# Patient Record
Sex: Female | Born: 1950 | Race: Black or African American | Hispanic: No | Marital: Single | Smoking: Never smoker
Health system: Southern US, Community
[De-identification: ages and names within clinical notes are randomized; demographics above are authoritative.]

## PROBLEM LIST (undated history)

## (undated) DIAGNOSIS — M8580 Other specified disorders of bone density and structure, unspecified site: Secondary | ICD-10-CM

## (undated) DIAGNOSIS — M1990 Unspecified osteoarthritis, unspecified site: Secondary | ICD-10-CM

## (undated) DIAGNOSIS — K449 Diaphragmatic hernia without obstruction or gangrene: Secondary | ICD-10-CM

## (undated) DIAGNOSIS — N3281 Overactive bladder: Secondary | ICD-10-CM

## (undated) DIAGNOSIS — N6489 Other specified disorders of breast: Secondary | ICD-10-CM

## (undated) DIAGNOSIS — K219 Gastro-esophageal reflux disease without esophagitis: Secondary | ICD-10-CM

## (undated) DIAGNOSIS — I10 Essential (primary) hypertension: Secondary | ICD-10-CM

## (undated) DIAGNOSIS — F419 Anxiety disorder, unspecified: Secondary | ICD-10-CM

## (undated) HISTORY — PX: ABDOMINAL MASS RESECTION: SHX1110

## (undated) HISTORY — PX: BLADDER SURGERY: SHX569

## (undated) HISTORY — PX: BREAST BIOPSY: SHX20

## (undated) HISTORY — PX: UTERINE FIBROID SURGERY: SHX826

## (undated) HISTORY — PX: PARTIAL HYSTERECTOMY: SHX80

## (undated) HISTORY — DX: Diaphragmatic hernia without obstruction or gangrene: K44.9

## (undated) HISTORY — DX: Other specified disorders of bone density and structure, unspecified site: M85.80

## (undated) HISTORY — PX: ABDOMINAL HYSTERECTOMY: SHX81

## (undated) HISTORY — DX: Gastro-esophageal reflux disease without esophagitis: K21.9

## (undated) HISTORY — DX: Overactive bladder: N32.81

---

## 1998-11-16 ENCOUNTER — Ambulatory Visit (HOSPITAL_COMMUNITY): Admission: RE | Admit: 1998-11-16 | Discharge: 1998-11-16 | Payer: Self-pay | Admitting: *Deleted

## 1999-08-19 ENCOUNTER — Other Ambulatory Visit: Admission: RE | Admit: 1999-08-19 | Discharge: 1999-08-19 | Payer: Self-pay | Admitting: *Deleted

## 2000-06-23 ENCOUNTER — Other Ambulatory Visit: Admission: RE | Admit: 2000-06-23 | Discharge: 2000-06-23 | Payer: Self-pay | Admitting: *Deleted

## 2000-07-02 ENCOUNTER — Encounter: Payer: Self-pay | Admitting: Emergency Medicine

## 2000-07-02 ENCOUNTER — Emergency Department (HOSPITAL_COMMUNITY): Admission: EM | Admit: 2000-07-02 | Discharge: 2000-07-02 | Payer: Self-pay | Admitting: Emergency Medicine

## 2000-10-29 ENCOUNTER — Encounter: Payer: Self-pay | Admitting: *Deleted

## 2000-10-29 ENCOUNTER — Encounter: Admission: RE | Admit: 2000-10-29 | Discharge: 2000-10-29 | Payer: Self-pay | Admitting: *Deleted

## 2002-05-04 ENCOUNTER — Other Ambulatory Visit: Admission: RE | Admit: 2002-05-04 | Discharge: 2002-05-04 | Payer: Self-pay | Admitting: *Deleted

## 2002-10-14 ENCOUNTER — Encounter: Admission: RE | Admit: 2002-10-14 | Discharge: 2002-10-14 | Payer: Self-pay | Admitting: *Deleted

## 2002-10-14 ENCOUNTER — Encounter: Payer: Self-pay | Admitting: *Deleted

## 2003-04-24 ENCOUNTER — Other Ambulatory Visit: Admission: RE | Admit: 2003-04-24 | Discharge: 2003-04-24 | Payer: Self-pay | Admitting: *Deleted

## 2003-07-21 ENCOUNTER — Ambulatory Visit (HOSPITAL_COMMUNITY): Admission: RE | Admit: 2003-07-21 | Discharge: 2003-07-21 | Payer: Self-pay | Admitting: Urology

## 2003-07-21 ENCOUNTER — Encounter (INDEPENDENT_AMBULATORY_CARE_PROVIDER_SITE_OTHER): Payer: Self-pay | Admitting: Specialist

## 2003-07-21 ENCOUNTER — Ambulatory Visit (HOSPITAL_BASED_OUTPATIENT_CLINIC_OR_DEPARTMENT_OTHER): Admission: RE | Admit: 2003-07-21 | Discharge: 2003-07-21 | Payer: Self-pay | Admitting: Urology

## 2004-03-25 ENCOUNTER — Encounter: Admission: RE | Admit: 2004-03-25 | Discharge: 2004-03-25 | Payer: Self-pay | Admitting: *Deleted

## 2004-04-09 ENCOUNTER — Other Ambulatory Visit: Admission: RE | Admit: 2004-04-09 | Discharge: 2004-04-09 | Payer: Self-pay | Admitting: *Deleted

## 2004-07-07 ENCOUNTER — Ambulatory Visit (HOSPITAL_COMMUNITY): Admission: RE | Admit: 2004-07-07 | Discharge: 2004-07-07 | Payer: Self-pay | Admitting: Emergency Medicine

## 2005-01-24 ENCOUNTER — Emergency Department (HOSPITAL_COMMUNITY): Admission: EM | Admit: 2005-01-24 | Discharge: 2005-01-24 | Payer: Self-pay | Admitting: Emergency Medicine

## 2005-01-28 ENCOUNTER — Ambulatory Visit: Payer: Self-pay | Admitting: Internal Medicine

## 2005-04-09 ENCOUNTER — Ambulatory Visit: Payer: Self-pay | Admitting: Internal Medicine

## 2005-04-14 ENCOUNTER — Ambulatory Visit: Payer: Self-pay | Admitting: Internal Medicine

## 2005-05-07 ENCOUNTER — Other Ambulatory Visit: Admission: RE | Admit: 2005-05-07 | Discharge: 2005-05-07 | Payer: Self-pay | Admitting: *Deleted

## 2005-05-07 ENCOUNTER — Encounter: Admission: RE | Admit: 2005-05-07 | Discharge: 2005-05-07 | Payer: Self-pay | Admitting: *Deleted

## 2005-10-28 ENCOUNTER — Encounter: Admission: RE | Admit: 2005-10-28 | Discharge: 2005-10-28 | Payer: Self-pay | Admitting: Otolaryngology

## 2006-05-14 ENCOUNTER — Other Ambulatory Visit: Admission: RE | Admit: 2006-05-14 | Discharge: 2006-05-14 | Payer: Self-pay | Admitting: *Deleted

## 2006-09-17 ENCOUNTER — Encounter: Admission: RE | Admit: 2006-09-17 | Discharge: 2006-09-17 | Payer: Self-pay | Admitting: *Deleted

## 2006-09-28 ENCOUNTER — Encounter: Admission: RE | Admit: 2006-09-28 | Discharge: 2006-09-28 | Payer: Self-pay | Admitting: *Deleted

## 2007-04-21 ENCOUNTER — Encounter: Admission: RE | Admit: 2007-04-21 | Discharge: 2007-06-04 | Payer: Self-pay | Admitting: Family Medicine

## 2007-06-01 ENCOUNTER — Other Ambulatory Visit: Admission: RE | Admit: 2007-06-01 | Discharge: 2007-06-01 | Payer: Self-pay | Admitting: *Deleted

## 2007-09-29 ENCOUNTER — Encounter: Admission: RE | Admit: 2007-09-29 | Discharge: 2007-09-29 | Payer: Self-pay | Admitting: *Deleted

## 2008-10-03 ENCOUNTER — Encounter: Admission: RE | Admit: 2008-10-03 | Discharge: 2008-10-03 | Payer: Self-pay | Admitting: Gynecology

## 2008-10-09 ENCOUNTER — Other Ambulatory Visit: Admission: RE | Admit: 2008-10-09 | Discharge: 2008-10-09 | Payer: Self-pay | Admitting: Gynecology

## 2009-01-15 ENCOUNTER — Encounter: Admission: RE | Admit: 2009-01-15 | Discharge: 2009-01-15 | Payer: Self-pay | Admitting: Family Medicine

## 2009-02-05 ENCOUNTER — Encounter: Admission: RE | Admit: 2009-02-05 | Discharge: 2009-02-05 | Payer: Self-pay | Admitting: Family Medicine

## 2009-06-06 ENCOUNTER — Encounter (INDEPENDENT_AMBULATORY_CARE_PROVIDER_SITE_OTHER): Payer: Self-pay | Admitting: Diagnostic Radiology

## 2009-06-06 ENCOUNTER — Encounter: Admission: RE | Admit: 2009-06-06 | Discharge: 2009-06-06 | Payer: Self-pay | Admitting: Surgery

## 2009-12-26 ENCOUNTER — Encounter: Admission: RE | Admit: 2009-12-26 | Discharge: 2009-12-26 | Payer: Self-pay | Admitting: Surgery

## 2010-10-27 ENCOUNTER — Encounter: Payer: Self-pay | Admitting: Family Medicine

## 2010-10-27 ENCOUNTER — Encounter: Payer: Self-pay | Admitting: *Deleted

## 2011-02-21 NOTE — Op Note (Signed)
NAME:  Hannah Christian, Hannah Christian                        ACCOUNT NO.:  [id number]   MEDICAL RECORD NO.:  [id number]                   PATIENT TYPE:  AMB   LOCATION:  [facility]                                 FACILITY:  [facility]   PHYSICIAN:  Sigmund I. Patsi Sears, M.D.         DATE OF BIRTH:  04-07-1951   DATE OF PROCEDURE:  07/21/2003  DATE OF DISCHARGE:                                 OPERATIVE REPORT   PREOPERATIVE DIAGNOSIS:  Questionable interstitial cystitis.   POSTOPERATIVE DIAGNOSIS:  Likely interstitial cystitis.   PROCEDURE:  Cystoscopy, hydrodistention, bladder biopsy, Clorpactin  installation, Pyridium/Marcaine installation, Marcaine/Kenalog subtrigonal  injection.   SURGEON:  Sigmund I. Patsi Sears, M.D.   ASSISTANT:  Susanne Borders, MD   ANESTHESIA:  General endotracheal.   COMPLICATIONS:  None.   DRAINS:  None.   DISPOSITION:  To post anesthesia care unit in stable condition.   INDICATIONS FOR PROCEDURE:  [name] is a 60 year old African-American  female who has a long history of interstitial cystitis. She has previously  been followed by Dr. Elige Radon and has had hydrodistentions in the past. She  has marked complaints of intermittency and frequency and has interstitial  cystitis symptoms score of 5/5 and a bladder score of 5/5. She consented to  cystoscopy with hydrodistention and Clorpactin installation after  understanding the risks, benefits, and alternatives.   DESCRIPTION OF PROCEDURE:  The patient was brought to the operating room and  correctly identified by her identification bracelet. She was given general  endotracheal anesthesia and preoperative antibiotics. Her genitalia were  prepped and draped in typical sterile fashion. A 22 French cystoscope with a  12 degree lens was used to visualize the patient's urethra and bladder. The  bladder and urethral mucosa were initially somewhat injected but no obvious  gross abnormalities were appreciated after a systematic  survey. Bilateral  ureteral orifices were identified in their normal anatomic location. The  bladder was allowed to distend and the patient began to have leakage from  her urethra. The bladder was then drained and under anesthesia the bladder  capacity was about 650 mL. The cystoscope was replaced and the bladder was  noted to be much more erythematous with some potential Hunner's ulcers. Two  biopsies were taken, one from the right bladder base and one from the left  bladder base. A Bugbee electrode was used to fulgurate these biopsy sites.  The bladder was then drained and a red rubber catheter was used to instill  Clorpactin in the patient's bladder. The bladder was then irrigated with  normal saline. A mixture of Pyridium and Marcaine was then instilled into  the patient's bladder and a catheter was removed. A subtrigonal injection of  a  mixture of Kenalog and Marcaine was then injected. The patient was then  awakened from her anesthesia without complications and taken to the post  anesthesia care unit in stable condition. Please note that Dr. Patsi Sears  was present and participated  in all aspects of the case as he was the  primary surgeon.     Susanne Borders, MD                           Sigmund I. Patsi Sears, M.D.    DR/MEDQ  D:  07/21/2003  T:  07/21/2003  Job:  [id number]

## 2011-10-07 HISTORY — PX: OTHER SURGICAL HISTORY: SHX169

## 2012-08-27 ENCOUNTER — Other Ambulatory Visit: Payer: Self-pay | Admitting: Gynecology

## 2012-08-27 DIAGNOSIS — Z1231 Encounter for screening mammogram for malignant neoplasm of breast: Secondary | ICD-10-CM

## 2012-10-07 ENCOUNTER — Ambulatory Visit: Payer: Self-pay

## 2013-04-26 ENCOUNTER — Ambulatory Visit
Admission: RE | Admit: 2013-04-26 | Discharge: 2013-04-26 | Disposition: A | Discharge status: Home / Self Care | Payer: BC Managed Care – PPO | Source: Ambulatory Visit | Attending: Gynecology | Admitting: Gynecology

## 2013-04-26 DIAGNOSIS — Z1231 Encounter for screening mammogram for malignant neoplasm of breast: Secondary | ICD-10-CM

## 2014-09-07 ENCOUNTER — Other Ambulatory Visit: Payer: Self-pay | Admitting: Gynecology

## 2014-09-08 LAB — CYTOLOGY - PAP

## 2015-02-16 ENCOUNTER — Encounter: Payer: Self-pay | Admitting: *Deleted

## 2015-11-15 ENCOUNTER — Other Ambulatory Visit: Payer: Self-pay | Admitting: Nurse Practitioner

## 2015-11-15 DIAGNOSIS — N644 Mastodynia: Secondary | ICD-10-CM

## 2015-11-16 ENCOUNTER — Ambulatory Visit
Admission: RE | Admit: 2015-11-16 | Discharge: 2015-11-16 | Disposition: A | Discharge status: Home / Self Care | Payer: BC Managed Care – PPO | Source: Ambulatory Visit | Attending: Nurse Practitioner | Admitting: Nurse Practitioner

## 2015-11-16 DIAGNOSIS — N644 Mastodynia: Secondary | ICD-10-CM

## 2015-11-21 ENCOUNTER — Telehealth: Payer: Self-pay | Admitting: Cardiovascular Disease

## 2015-11-21 NOTE — Telephone Encounter (Signed)
Received records from [facility] for appointment with Dr Tresa Endo on 11/30/15.  Records given to [facility] (medical records) for Dr Landry Dyke schedule on 11/30/15. lp

## 2015-11-30 ENCOUNTER — Telehealth: Payer: Self-pay | Admitting: Cardiovascular Disease

## 2015-11-30 ENCOUNTER — Encounter: Payer: Self-pay | Admitting: Cardiovascular Disease

## 2015-11-30 ENCOUNTER — Ambulatory Visit (INDEPENDENT_AMBULATORY_CARE_PROVIDER_SITE_OTHER): Payer: BC Managed Care – PPO | Admitting: Cardiovascular Disease

## 2015-11-30 VITALS — BP 130/74 | HR 98 | Ht 68.5 in | Wt 180.8 lb

## 2015-11-30 DIAGNOSIS — R9431 Abnormal electrocardiogram [ECG] [EKG]: Secondary | ICD-10-CM

## 2015-11-30 DIAGNOSIS — E785 Hyperlipidemia, unspecified: Secondary | ICD-10-CM

## 2015-11-30 DIAGNOSIS — R0789 Other chest pain: Secondary | ICD-10-CM

## 2015-11-30 DIAGNOSIS — Z658 Other specified problems related to psychosocial circumstances: Secondary | ICD-10-CM

## 2015-11-30 DIAGNOSIS — Z13220 Encounter for screening for lipoid disorders: Secondary | ICD-10-CM | POA: Diagnosis not present

## 2015-11-30 DIAGNOSIS — R079 Chest pain, unspecified: Secondary | ICD-10-CM

## 2015-11-30 DIAGNOSIS — K219 Gastro-esophageal reflux disease without esophagitis: Secondary | ICD-10-CM

## 2015-11-30 DIAGNOSIS — F439 Reaction to severe stress, unspecified: Secondary | ICD-10-CM

## 2015-11-30 NOTE — Patient Instructions (Signed)
Your physician has requested that you have an echocardiogram. Echocardiography is a painless test that uses sound waves to create images of your heart. It provides your doctor with information about the size and shape of your heart and how well your heart's chambers and valves are working. This procedure takes approximately one hour. There are no restrictions for this procedure.  Your physician has requested that you have en exercise stress myoview. For further information please visit [email or web address] Please follow instruction sheet, as given.  Your physician recommends that you return for lab work FASTING.  Your physician recommends that you schedule a follow-up appointment in: 12/25/15 with dr Tresa Endo.

## 2015-11-30 NOTE — Telephone Encounter (Signed)
Pt notes intermittent left arm pain in addition to other complaints seen for today.  She acknowledged recommendations Dr. Tresa Endo has as far as testing - pending myoview and echo.  Pt understands she should wait for Dr. Landry Dyke instruction as far as exercise. She had previously walked 3 times a week and done light muscle conditioning on gym machines, but has not done any exercise in about a month.  Routed to Dr. Tresa Endo for any instruction.

## 2015-11-30 NOTE — Telephone Encounter (Signed)
Pt was seen today.She forgot to tell Dr Tresa Endo about the intermittent pain in her left arm. She is not sure,it might be muscular pain. She also wants to know if she can exercise?

## 2015-12-02 ENCOUNTER — Encounter: Payer: Self-pay | Admitting: Cardiovascular Disease

## 2015-12-02 DIAGNOSIS — R0789 Other chest pain: Secondary | ICD-10-CM | POA: Insufficient documentation

## 2015-12-02 DIAGNOSIS — K219 Gastro-esophageal reflux disease without esophagitis: Secondary | ICD-10-CM | POA: Insufficient documentation

## 2015-12-02 DIAGNOSIS — F439 Reaction to severe stress, unspecified: Secondary | ICD-10-CM | POA: Insufficient documentation

## 2015-12-02 DIAGNOSIS — R9431 Abnormal electrocardiogram [ECG] [EKG]: Secondary | ICD-10-CM | POA: Insufficient documentation

## 2015-12-02 DIAGNOSIS — E785 Hyperlipidemia, unspecified: Secondary | ICD-10-CM | POA: Insufficient documentation

## 2015-12-02 NOTE — Progress Notes (Signed)
Patient ID: LINNAEA AHN, female   DOB: [birth year], 65 y.o.   MRN: [id number]     Primary MD: Dr. Sonny Masters Smith/ Wynelle Link  PATIENT PROFILE: Hannah Christian is a 65 y.o. female who is referred through the courtesy of Dr. Wynelle Link for evaluation of right-sided chest discomfort.   HPI:  Hannah Christian has a history of hypertension, lipidemia, GERD, and osteopenia.  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  As result of this increased stress.  She started to smoke cigarettes intermittently smoking 2-3 cigarettes per day but has just stopped this.  She recently presented to Dr. soon with complaints of right-sided breast and chest discomfort which were intermittent.  They're not exertionally precipitated.  She also noticed more discomfort in the right breast region.  She was seen by her gynecologist and a diagnostic mammogram apparently showed "scar tissue," but without abnormalities to explain the pain.  She is now referred for cardiology evaluation.  She has a history of GERD and has been taken out low Vera juice with lemonade with apparent benefit.  She states both Prilosec or Nexium were not helpful.  She has been on amlodipine at 2.5 mg for the past several years for hypertension.  She takes over-the-counter fish oil but has not been on any statin therapy for her lipids.  Past Medical History  Diagnosis Date  . Acid reflux   . Hiatal hernia   . Osteopenia   . Overactive bladder     Past Surgical History  Procedure Laterality Date  . Partial hysterectomy      ovaries intact  . Uterine fibroid surgery      x2  . Abdominal mass resection      lower, benign  . Bladder surgery    . Breast biopsy    . Bone spur Right 2013    Allergies  Allergen Reactions  . Codeine Hives    Current Outpatient Prescriptions  Medication Sig Dispense Refill  . ALOE VERA JUICE PO Take 1 Dose by mouth daily  as needed (acid reflux).    Marland Kitchen amLODipine (NORVASC) 2.5 MG tablet Take 2.5 mg by mouth daily.    . Cholecalciferol (VITAMIN D-3 PO) Take 1 tablet by mouth daily.    . Multiple Vitamins-Minerals (MULTIVITAMIN WOMEN 50+ PO) Take 1 tablet by mouth daily.    . Omega-3 Fatty Acids (ULTRA OMEGA-3 FISH OIL) 1400 MG CAPS Take 1 capsule by mouth daily.    . TURMERIC CURCUMIN PO Take 1 tablet by mouth daily.     No current facility-administered medications for this visit.    Social History   Social History  . Marital Status: Single    Spouse Name: N/A  . Number of Children: 0  . Years of Education: N/A   Occupational History  . Not on file.   Social History Main Topics  . Smoking status: Never Smoker   . Smokeless tobacco: Never Used  . Alcohol Use: 0.0 oz/week    0 Standard drinks or equivalent per week     Comment: socially  . Drug Use: No  . Sexual Activity: Not on file   Other Topics Concern  . 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  She is single.  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  With her recent increased stress she has  been drinking wine to calm nerves and also started to smoke 2-3 cigarettes per day.  Family History  Problem Relation Age of Onset  . Diabetes Father   . Hypertension Father   . Hypertension Mother   . Diabetes Paternal Grandmother   . Hypertension Brother   . Colon cancer Father   . Lymphoma Mother   . Alzheimer's disease Mother   . Ovarian cancer Maternal Grandmother    Family history is notable that her mother died at age 51 with cancer.  Father died at age 38.  A maternal grandmother died at 68 with heart failure.  ROS General: Negative; No fevers, chills, or night sweats HEENT: Negative; No changes in vision or hearing, sinus congestion, difficulty swallowing Pulmonary:  Negative; No cough, wheezing, shortness of breath, hemoptysis Cardiovascular:  See HPI;  GI: Positive for GERD GU: Negative; No dysuria, hematuria, or difficulty voiding Musculoskeletal: Negative; no myalgias, joint pain, or weakness Hematologic/Oncologic: Negative; no easy bruising, bleeding Endocrine: Negative; no heat/cold intolerance; no diabetes Neuro: Negative; no changes in balance, headaches Skin: Negative; No rashes or skin lesions Psychiatric: Negative; No behavioral problems, depression Sleep: Negative; No daytime sleepiness, hypersomnolence, bruxism, restless legs, hypnogagnic hallucinations Other comprehensive 14 point system review is negative   Physical Exam BP 130/74 mmHg  Pulse 98  Ht 5' 8.5" (1.74 m)  Wt 180 lb 12.8 oz (82.01 kg)  BMI 27.09 kg/m2  Wt Readings from Last 3 Encounters:  11/30/15 180 lb 12.8 oz (82.01 kg)   General: Alert, oriented, no distress.  Skin: normal turgor, no rashes, warm and dry HEENT: Normocephalic, atraumatic. Pupils equal round and reactive to light; sclera anicteric; extraocular muscles intact; Fundi without hemorrhages or exudates.  No AV nicking.  Disc flat Nose without nasal septal hypertrophy Mouth/Parynx benign; Mallinpatti scale 3 Neck: No JVD, no carotid bruits; normal carotid upstroke Lungs: clear to ausculatation and percussion; no wheezing or rales Chest wall: without tenderness to palpitation Heart: PMI not displaced, RRR, s1 s2 normal, 1/6 systolic murmur, no diastolic murmur, no rubs, gallops, thrills, or heaves Abdomen: soft, nontender; no hepatosplenomehaly, BS+; abdominal aorta nontender and not dilated by palpation. Back: no CVA tenderness Pulses 2+ Musculoskeletal: full range of motion, normal strength, no joint deformities Extremities: no clubbing cyanosis or edema, Homan's sign negative  Neurologic: grossly nonfocal; Cranial nerves grossly wnl Psychologic: Normal mood and affect   ECG (independently read by  me): Normal sinus rhythm at 98 bpm.  ST-T abnormalities inferolaterally.  Normal intervals.  LABS:  No flowsheet data found.   No flowsheet data found.  No flowsheet data found. No results found for: MCV No results found for: TSH No results found for: HGBA1C   BNP No results found for: BNP  ProBNP No results found for: PROBNP   Lipid Panel  No results found for: CHOL, TRIG, HDL, CHOLHDL, VLDL, LDLCALC, LDLDIRECT  RADIOLOGY: Mm Diag Breast Tomo Bilateral  11/16/2015  CLINICAL DATA:  Intermittent diffuse right breast pain for approximately 2 years. No reported lump. Patient has had a benign needle biopsy in the right breast. She did not discretely remember passing an excisional biopsy, but this was reported on prior history sheets and the patient does have a lower outer right breast scar. EXAM: DIGITAL DIAGNOSTIC BILATERAL MAMMOGRAM WITH 3D TOMOSYNTHESIS AND CAD COMPARISON:  Previous exam(s). ACR Breast Density Category b: There are scattered areas of fibroglandular density. FINDINGS:  There are no discrete masses. Architectural distortion in the posterior lateral right breast is consistent with a previous excisional biopsy. It is stable from the previous year's exam. There are no other areas of architectural distortion. There are no suspicious calcifications. No mammographic change. Mammographic images were processed with CAD. IMPRESSION: No evidence of malignancy. Benign postsurgical scarring lateral posterior right breast. RECOMMENDATION: Screening mammogram in one year.(Code:SM-B-01Y) I have discussed the findings and recommendations with the patient. Results were also provided in writing at the conclusion of the visit. If applicable, a reminder letter will be sent to the patient regarding the next appointment. BI-RADS CATEGORY  2: Benign. Electronically Signed   By: Amie Portland M.D.   On: 11/16/2015 16:43     ASSESSMENT AND PLAN: Ms. Eleshia Wooley is a 65 year old  African-American female who has a history of hypertension and has been on amlodipine at low-dose 2.5 mg daily.  Her blood pressure today continues to be stable.  She also has history of GERD for which she states is improved with aloe vera juice.  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  This has resulted in recent tobacco use, but since she had seen Dr. Wynelle Link, she has discontinued this.  She has noted some atypical chest pain.  He mammogram revealed scar, but no real reason for right chest discomfort.  She does have inferolateral ST changes on her ECG both in the office here today and on the ECG, which was done at Dr. Chase Caller office.  I am recommending that she undergo a 2-D echo Doppler study to evaluate both systolic and diastolic function and further evaluate her soft systolic murmur.  With her ECG changes and chest pain, I am scheduling her for an exercise stress Myoview study to make certain there is no underlying ischemia in the deep in the etiology of her discomfort.  Fasting lab work will be obtained.  I will see her back in the office in follow-up of the above studies and further recommendations will be made at that time.   Hannah Bihari, MD, [facility] 12/02/2015 9:43 AM

## 2015-12-02 NOTE — Telephone Encounter (Signed)
Probable muscular pain; ok to exercise mildly  but would not push exercise until stress test is done

## 2015-12-03 LAB — CBC
HCT: 40.2 % (ref 36.0–46.0)
Hemoglobin: 13.5 g/dL (ref 12.0–15.0)
MCH: 33.1 pg (ref 26.0–34.0)
MCHC: 33.6 g/dL (ref 30.0–36.0)
MCV: 98.5 fL (ref 78.0–100.0)
MPV: 11.1 fL (ref 8.6–12.4)
Platelets: 183 10*3/uL (ref 150–400)
RBC: 4.08 MIL/uL (ref 3.87–5.11)
RDW: 12.5 % (ref 11.5–15.5)
WBC: 7 10*3/uL (ref 4.0–10.5)

## 2015-12-03 LAB — COMPREHENSIVE METABOLIC PANEL
ALT: 23 U/L (ref 6–29)
AST: 24 U/L (ref 10–35)
Albumin: 3.8 g/dL (ref 3.6–5.1)
Alkaline Phosphatase: 92 U/L (ref 33–130)
BUN: 11 mg/dL (ref 7–25)
CO2: 30 mmol/L (ref 20–31)
Calcium: 8.9 mg/dL (ref 8.6–10.4)
Chloride: 103 mmol/L (ref 98–110)
Creat: 0.89 mg/dL (ref 0.50–0.99)
Glucose, Bld: 99 mg/dL (ref 65–99)
Potassium: 4.4 mmol/L (ref 3.5–5.3)
Sodium: 139 mmol/L (ref 135–146)
Total Bilirubin: 0.4 mg/dL (ref 0.2–1.2)
Total Protein: 7.1 g/dL (ref 6.1–8.1)

## 2015-12-03 LAB — LIPID PANEL
Cholesterol: 185 mg/dL (ref 125–200)
HDL: 59 mg/dL (ref >=46)
LDL Cholesterol: 111 mg/dL (ref <130)
Total CHOL/HDL Ratio: 3.1 Ratio (ref <=5.0)
Triglycerides: 75 mg/dL (ref <150)
VLDL: 15 mg/dL (ref <30)

## 2015-12-03 LAB — TSH: TSH: 1.99 mIU/L

## 2015-12-03 NOTE — Telephone Encounter (Signed)
Questions answered and recommendations given regarding exercise, upcoming tests, etc. Pt states questions she had were adequately addressed.

## 2015-12-04 ENCOUNTER — Ambulatory Visit (HOSPITAL_COMMUNITY): Payer: BC Managed Care – PPO | Attending: Cardiovascular Disease

## 2015-12-04 ENCOUNTER — Other Ambulatory Visit: Payer: Self-pay

## 2015-12-04 DIAGNOSIS — I351 Nonrheumatic aortic (valve) insufficiency: Secondary | ICD-10-CM | POA: Insufficient documentation

## 2015-12-04 DIAGNOSIS — I119 Hypertensive heart disease without heart failure: Secondary | ICD-10-CM | POA: Insufficient documentation

## 2015-12-04 DIAGNOSIS — R9431 Abnormal electrocardiogram [ECG] [EKG]: Secondary | ICD-10-CM | POA: Diagnosis not present

## 2015-12-04 DIAGNOSIS — E785 Hyperlipidemia, unspecified: Secondary | ICD-10-CM | POA: Insufficient documentation

## 2015-12-04 DIAGNOSIS — R079 Chest pain, unspecified: Secondary | ICD-10-CM | POA: Insufficient documentation

## 2015-12-05 ENCOUNTER — Telehealth (HOSPITAL_COMMUNITY): Payer: Self-pay

## 2015-12-05 NOTE — Telephone Encounter (Signed)
Encounter complete. 

## 2015-12-06 ENCOUNTER — Ambulatory Visit (HOSPITAL_COMMUNITY)
Admission: RE | Admit: 2015-12-06 | Discharge: 2015-12-06 | Disposition: A | Discharge status: Home / Self Care | Payer: BC Managed Care – PPO | Source: Ambulatory Visit | Attending: Cardiovascular Disease | Admitting: Cardiovascular Disease

## 2015-12-06 DIAGNOSIS — R079 Chest pain, unspecified: Secondary | ICD-10-CM | POA: Diagnosis not present

## 2015-12-06 DIAGNOSIS — E663 Overweight: Secondary | ICD-10-CM | POA: Insufficient documentation

## 2015-12-06 DIAGNOSIS — R9431 Abnormal electrocardiogram [ECG] [EKG]: Secondary | ICD-10-CM | POA: Insufficient documentation

## 2015-12-06 DIAGNOSIS — I10 Essential (primary) hypertension: Secondary | ICD-10-CM | POA: Diagnosis not present

## 2015-12-06 DIAGNOSIS — Z6827 Body mass index (BMI) 27.0-27.9, adult: Secondary | ICD-10-CM | POA: Insufficient documentation

## 2015-12-06 DIAGNOSIS — F17200 Nicotine dependence, unspecified, uncomplicated: Secondary | ICD-10-CM | POA: Insufficient documentation

## 2015-12-06 LAB — MYOCARDIAL PERFUSION IMAGING
Estimated workload: 10.1 METS
Exercise duration (min): 9 min
LV dias vol: 64 mL
LV sys vol: 21 mL
MPHR: 157 {beats}/min
Peak HR: 142 {beats}/min
Percent HR: 90 %
RPE: 16
Rest HR: 67 {beats}/min
SDS: 0
SRS: 0
SSS: 0
TID: 1.25

## 2015-12-06 MED ORDER — TECHNETIUM TC 99M SESTAMIBI GENERIC - CARDIOLITE
31.0000 | Freq: Once | INTRAVENOUS | Status: AC | PRN
  Administered 2015-12-06: 31 via INTRAVENOUS

## 2015-12-06 MED ORDER — TECHNETIUM TC 99M SESTAMIBI GENERIC - CARDIOLITE
10.5000 | Freq: Once | INTRAVENOUS | Status: AC | PRN
  Administered 2015-12-06: 10.5 via INTRAVENOUS

## 2015-12-07 ENCOUNTER — Ambulatory Visit: Payer: BC Managed Care – PPO | Admitting: Internal Medicine

## 2015-12-12 ENCOUNTER — Encounter: Payer: Self-pay | Admitting: *Deleted

## 2015-12-25 ENCOUNTER — Ambulatory Visit (INDEPENDENT_AMBULATORY_CARE_PROVIDER_SITE_OTHER): Payer: BC Managed Care – PPO | Admitting: Cardiovascular Disease

## 2015-12-25 ENCOUNTER — Encounter: Payer: Self-pay | Admitting: Cardiovascular Disease

## 2015-12-25 VITALS — BP 114/76 | HR 66 | Ht 65.0 in | Wt 182.0 lb

## 2015-12-25 DIAGNOSIS — E785 Hyperlipidemia, unspecified: Secondary | ICD-10-CM | POA: Diagnosis not present

## 2015-12-25 DIAGNOSIS — R0789 Other chest pain: Secondary | ICD-10-CM

## 2015-12-25 DIAGNOSIS — K219 Gastro-esophageal reflux disease without esophagitis: Secondary | ICD-10-CM

## 2015-12-25 DIAGNOSIS — Z658 Other specified problems related to psychosocial circumstances: Secondary | ICD-10-CM | POA: Diagnosis not present

## 2015-12-25 DIAGNOSIS — F439 Reaction to severe stress, unspecified: Secondary | ICD-10-CM

## 2015-12-25 NOTE — Progress Notes (Signed)
Patient ID: Hannah Christian, female   DOB: 06/30/1951, 65 y.o.   MRN: [id number]     Primary MD: Dr. Hal Hope Smith/ Nancy Fetter  PATIENT PROFILE: Hannah Christian is a 65 y.o. female who I initially saw 1 month ago through the courtesy of Dr. Nancy Fetter for evaluation of right-sided chest discomfort.  She presents for follow-up evaluation.   HPI:  Hannah Christian has a history of hypertension, lipidemia, GERD, and osteopenia.  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  As result of this increased stress she started to smoke cigarettes intermittently smoking 2-3 cigarettes per day but has just stopped this.  She presented to Dr. Nancy Fetter with complaints of right-sided breast and chest discomfort which were intermittent and  not exertionally precipitated.  She also noticed more discomfort in the right breast region.  She was seen by her gynecologist and a diagnostic mammogram apparently showed "scar tissue," but without abnormalities to explain the pain.  She is now referred for cardiology evaluation.  She has a history of GERD and has been taking Aloe Vera juice with lemonade with apparent benefit.  She states both Prilosec or Nexium were not helpful.  She has been on amlodipine at 2.5 mg for the past several years for hypertension.  She takes over-the-counter fish oil but has not been on any statin therapy for her lipids.  As part of further evaluation, she underwent a nuclear stress test which she was able to exercise for 9 minutes and reached a peak heart rate of 142 and peak blood pressure 176/56 without chest pain, development or ECG changes.  Workload was 10.1 metastases.  Scintigraphic images show normal perfusion and normal function.  An echo Doppler study was done from her 28 2017 and showed an ejection fraction at 60-65% with mild LVH and no regional wall motion abnormalities.  There was evidence for a 5 aortic  valve with normal mobility and mild aortic insufficiency.  Her mitral valve leaflets were mildly thickened and there was trivial MR.  Clinically, she has felt well.  She denies any recent increasing chest pain.  She presents for follow-up evaluation.  Past Medical History  Diagnosis Date  . Acid reflux   . Hiatal hernia   . Osteopenia   . Overactive bladder     Past Surgical History  Procedure Laterality Date  . Partial hysterectomy      ovaries intact  . Uterine fibroid surgery      x2  . Abdominal mass resection      lower, benign  . Bladder surgery    . Breast biopsy    . Bone spur Right 2013    Allergies  Allergen Reactions  . Codeine Hives    Current Outpatient Prescriptions  Medication Sig Dispense Refill  . ALOE VERA JUICE PO Take 1 Dose by mouth daily as needed (acid reflux).    Marland Kitchen amLODipine (NORVASC) 2.5 MG tablet Take 2.5 mg by mouth daily.    . Cholecalciferol (VITAMIN D-3 PO) Take 1 tablet by mouth daily.    . Multiple Vitamins-Minerals (MULTIVITAMIN WOMEN 50+ PO) Take 1 tablet by mouth daily.    . Omega-3 Fatty Acids (ULTRA OMEGA-3 FISH OIL) 1400 MG CAPS Take 1 capsule by mouth daily.    . TURMERIC CURCUMIN PO Take 1 tablet by mouth daily.     No current facility-administered medications for this visit.    Social History  Social History  . Marital Status: Single    Spouse Name: N/A  . Number of Children: 0  . Years of Education: N/A   Occupational History  . Not on file.   Social History Main Topics  . Smoking status: Never Smoker   . Smokeless tobacco: Never Used  . Alcohol Use: 0.0 oz/week    0 Standard drinks or equivalent per week     Comment: socially  . Drug Use: No  . Sexual Activity: Not on file   Other Topics Concern  . 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  She is single.  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  With her recent increased stress she had been drinking wine to calm nerves and also started to smoke 2-3 cigarettes per day , which she had quit  Family History  Problem Relation Age of Onset  . Diabetes Father   . Hypertension Father   . Hypertension Mother   . Diabetes Paternal Grandmother   . Hypertension Brother   . Colon cancer Father   . Lymphoma Mother   . Alzheimer's disease Mother   . Ovarian cancer Maternal Grandmother    Family history is notable that her mother died at age 7 with cancer.  Father died at age 62.  A maternal grandmother died at 43 with heart failure.  ROS General: Negative; No fevers, chills, or night sweats HEENT: Negative; No changes in vision or hearing, sinus congestion, difficulty swallowing Pulmonary: Negative; No cough, wheezing, shortness of breath, hemoptysis Cardiovascular:  See HPI;  GI: Positive for GERD GU: Negative; No dysuria, hematuria, or difficulty voiding Musculoskeletal: Negative; no myalgias, joint pain, or weakness Hematologic/Oncologic: Negative; no easy bruising, bleeding Endocrine: Negative; no heat/cold intolerance; no diabetes Neuro: Negative; no changes in balance, headaches Skin: Negative; No rashes or skin lesions Psychiatric: Negative; No behavioral problems, depression Sleep: Negative; No daytime sleepiness, hypersomnolence, bruxism, restless legs, hypnogagnic hallucinations Other comprehensive 14 point system review is negative   Physical Exam BP 114/76 mmHg  Pulse 66  Ht 5' 5"  (1.651 m)  Wt 182 lb (82.555 kg)  BMI 30.29 kg/m2  SpO2 97%  Wt Readings from Last 3 Encounters:  12/25/15 182 lb (82.555 kg)  12/06/15 180 lb (81.647 kg)  11/30/15 180 lb 12.8 oz (82.01 kg)   General: Alert, oriented, no distress.  Skin: normal turgor, no rashes, warm and dry HEENT: Normocephalic, atraumatic. Pupils equal round and reactive to light; sclera anicteric; extraocular  muscles intact; Fundi without hemorrhages or exudates.  No AV nicking.  Disc flat Nose without nasal septal hypertrophy Mouth/Parynx benign; Mallinpatti scale 3 Neck: No JVD, no carotid bruits; normal carotid upstroke Lungs: clear to ausculatation and percussion; no wheezing or rales Chest wall: without tenderness to palpitation Heart: PMI not displaced, RRR, s1 s2 normal, 1/6 systolic murmur, no diastolic murmur, no rubs, gallops, thrills, or heaves Abdomen: soft, nontender; no hepatosplenomehaly, BS+; abdominal aorta nontender and not dilated by palpation. Back: no CVA tenderness Pulses 2+ Musculoskeletal: full range of motion, normal strength, no joint deformities Extremities: no clubbing cyanosis or edema, Homan's sign negative  Neurologic: grossly nonfocal; Cranial nerves grossly wnl Psychologic: Normal mood and affect   11/30/2015 ECG (independently read by me): Normal sinus rhythm at 98 bpm.  ST-T abnormalities inferolaterally.  Normal intervals.  LABS:  BMP Latest Ref Rng 12/03/2015  Glucose 65 - 99 mg/dL 99  BUN 7 - 25 mg/dL 11  Creatinine 0.50 - 0.99 mg/dL 0.89  Sodium 135 - 146 mmol/L 139  Potassium 3.5 - 5.3 mmol/L 4.4  Chloride 98 - 110 mmol/L 103  CO2 20 - 31 mmol/L 30  Calcium 8.6 - 10.4 mg/dL 8.9     Hepatic Function Latest Ref Rng 12/03/2015  Total Protein 6.1 - 8.1 g/dL 7.1  Albumin 3.6 - 5.1 g/dL 3.8  AST 10 - 35 U/L 24  ALT 6 - 29 U/L 23  Alk Phosphatase 33 - 130 U/L 92  Total Bilirubin 0.2 - 1.2 mg/dL 0.4    CBC Latest Ref Rng 12/03/2015  WBC 4.0 - 10.5 K/uL 7.0  Hemoglobin 12.0 - 15.0 g/dL 13.5  Hematocrit 36.0 - 46.0 % 40.2  Platelets 150 - 400 K/uL 183   Lab Results  Component Value Date   MCV 98.5 12/03/2015   Lab Results  Component Value Date   TSH 1.99 12/03/2015   No results found for: HGBA1C   BNP No results found for: BNP  ProBNP No results found for: PROBNP   Lipid Panel     Component Value Date/Time   CHOL 185 12/03/2015  0855   TRIG 75 12/03/2015 0855   HDL 59 12/03/2015 0855   CHOLHDL 3.1 12/03/2015 0855   VLDL 15 12/03/2015 0855   LDLCALC 111 12/03/2015 0855    RADIOLOGY: No results found.   ASSESSMENT AND PLAN: Hannah Christian is a 65 year old African-American female who has a history of hypertension and has been on amlodipine at low-dose 2.5 mg daily.  Her blood pressure today continues to be stable.  She also has history of GERD for which she states is improved with aloe vera juice.  She had recently been under increased stress and had developed right-sided chest discomfort with atypical features.  I reviewed her nuclear perfusion study.  An echo Doppler dated with her in detail.  These show normal systolic function with mild LVH and probably early mild aortic sclerosis with mild AR and mild leaflet and mitral valve leaflets with trivial MR.  Her nuclear perfusion study is entirely normal and she exercised for 10 met workload without chest pain, development or ECG changes.  Scintigraphic images are normal.  I suspect her chest pain was noncardiac in etiology and probably musculoskeletal.  Recent lipid studies revealed a total cholesterol 185 with an LDL of 111, HDL 59, VLDL 15, and triglycerides 75.  I have suggested improved diet to achieve an LDL at least less than 100.  Clinically, I feel she is stable from a cardiovascular standpoint.  She will return to the routine care of her primary care physician.  I will be available to see her in the future if cardiac problems arise.Troy Sine, MD, [facility] 12/25/2015 1:05 PM

## 2015-12-25 NOTE — Patient Instructions (Signed)
Your physician recommends that you schedule a follow-up appointment as needed with Dr. Kelly. 

## 2015-12-26 ENCOUNTER — Encounter: Payer: Self-pay | Admitting: *Deleted

## 2017-01-01 IMAGING — NM NM MISC PROCEDURE
6 series · 36 of 36 positions shown · non-contrast
Comparison: none

[Series 1: wbr rest · 6.40mm/px · 6 of 64 frames shown]
[frame 6/64]
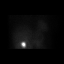
[frame 16/64]
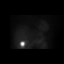
[frame 27/64]
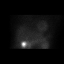
[frame 38/64]
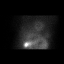
[frame 48/64]
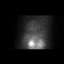
[frame 59/64]
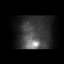

[Series 1: wbr_r-proj_st wbr rest · 6.40mm/px · 6 of 64 frames shown]
[frame 6/64]
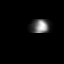
[frame 16/64]
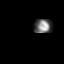
[frame 27/64]
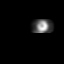
[frame 38/64]
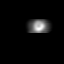
[frame 48/64]
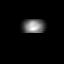
[frame 59/64]
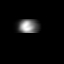

[Series 2: wbr stress-gsp · 6.40mm/px · 6 of 512 frames shown]
[frame 43/512]
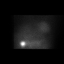
[frame 128/512]
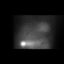
[frame 214/512]
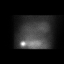
[frame 299/512]
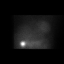
[frame 384/512]
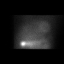
[frame 470/512]
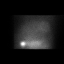

[Series 2: wbr_s-proj_st wbr stress-gsp · 6.40mm/px · 6 of 512 frames shown]
[frame 43/512]
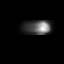
[frame 128/512]
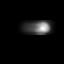
[frame 214/512]
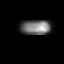
[frame 299/512]
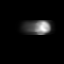
[frame 384/512]
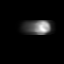
[frame 470/512]
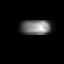

[Series 3: wbr_s-proj_st wbr stress-sum-em · 6.40mm/px · 6 of 64 frames shown]
[frame 6/64]
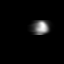
[frame 16/64]
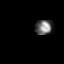
[frame 27/64]
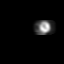
[frame 38/64]
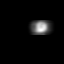
[frame 48/64]
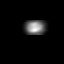
[frame 59/64]
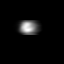

[Series 3: wbr stress-sum-em · 6.40mm/px · 6 of 64 frames shown]
[frame 6/64]
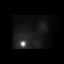
[frame 16/64]
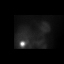
[frame 27/64]
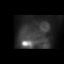
[frame 38/64]
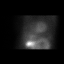
[frame 48/64]
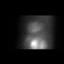
[frame 59/64]
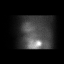

[36 of 36 positions shown; findings below may reference images not displayed]

Canned report from images found in remote index.

Refer to host system for actual result text.

## 2017-08-07 ENCOUNTER — Ambulatory Visit (INDEPENDENT_AMBULATORY_CARE_PROVIDER_SITE_OTHER): Payer: Medicare Other | Admitting: Podiatry

## 2017-08-07 ENCOUNTER — Other Ambulatory Visit: Payer: Self-pay | Admitting: Podiatry

## 2017-08-07 ENCOUNTER — Ambulatory Visit (INDEPENDENT_AMBULATORY_CARE_PROVIDER_SITE_OTHER): Payer: Medicare Other

## 2017-08-07 ENCOUNTER — Encounter: Payer: Self-pay | Admitting: Podiatry

## 2017-08-07 VITALS — BP 131/88 | HR 83

## 2017-08-07 DIAGNOSIS — R52 Pain, unspecified: Secondary | ICD-10-CM

## 2017-08-07 DIAGNOSIS — M722 Plantar fascial fibromatosis: Secondary | ICD-10-CM | POA: Diagnosis not present

## 2017-08-07 NOTE — Progress Notes (Signed)
   Subjective:    Patient ID: Hannah Christian, female    DOB: 12/20/1950, 66 y.o.   MRN: [id number]  HPI this patient presents the office with chief complaint of pain developing in the bottom of her right foot.  She says this pain is present for approximately 4 weeks.  She says that she has pain extending from the inside of the bottom of her foot to the outside of the bottom of her foot. She also describes the pain in her foot as a cramping that is present in her right foot and not in her left foot. She says that she has been walking in her community as opposed to walking on the treadmill.  She also says that she has had knee problems and feels that she may be walking differently to prevent knee pain.  She says she does wear orthotics which she received from Dr. Ralene CorkSikora years ago.  She presents the office today for an evaluation of this arch pain in her right foot.    Review of Systems  All other systems reviewed and are negative.      Objective:   Physical Exam  General Appearance  Alert, conversant and in no acute stress.  Vascular  Dorsalis pedis and posterior pulses are palpable  bilaterally.  Capillary return is within normal limits  Bilaterally. Temperature is within normal limits  Bilaterally  Neurologic  Senn-Weinstein monofilament wire test within normal limits  bilaterally. Muscle power  Within normal limits bilaterally.  Nails normal nails noted with no evidence of bacterial or fungal infection  Orthopedic  No limitations of motion of motion feet bilaterally.  No crepitus or effusions noted.  No bony pathology or digital deformities noted. S/P HAV right foot.  No evidence of redness, swelling or pain through the dorsal metatarsals.  No palpable pain noted through the bottom of her right foot.  Skin  normotropic skin with no porokeratosis noted bilaterally.  No signs of infections or ulcers noted.          Assessment & Plan:  Plantar fascitis right foot.  ROV  plantar fascial  brace was dispensed on her right foot  . She also brought her orthotics and I applied a felt arch support for her to wear when she wears her orthotics.  X-rays were taken reveal no evidence of any bony pathology except for the presence of 2 K wires at the site of the bunion surgery.  Patient declined any medication. RTC 4 weeks..Marland Kitchen

## 2017-09-30 ENCOUNTER — Other Ambulatory Visit: Payer: Medicare Other | Admitting: Orthotics

## 2018-08-13 ENCOUNTER — Other Ambulatory Visit: Payer: Self-pay | Admitting: Obstetrics and Gynecology

## 2018-08-13 DIAGNOSIS — N644 Mastodynia: Secondary | ICD-10-CM

## 2018-08-17 ENCOUNTER — Other Ambulatory Visit: Payer: BC Managed Care – PPO

## 2018-08-19 ENCOUNTER — Ambulatory Visit
Admission: RE | Admit: 2018-08-19 | Discharge: 2018-08-19 | Disposition: A | Discharge status: Home / Self Care | Payer: BC Managed Care – PPO | Source: Ambulatory Visit | Attending: Obstetrics and Gynecology | Admitting: Obstetrics and Gynecology

## 2018-08-19 ENCOUNTER — Ambulatory Visit
Admission: RE | Admit: 2018-08-19 | Discharge: 2018-08-19 | Disposition: A | Discharge status: Home / Self Care | Payer: Medicare Other | Source: Ambulatory Visit | Attending: Obstetrics and Gynecology | Admitting: Obstetrics and Gynecology

## 2018-08-19 DIAGNOSIS — N644 Mastodynia: Secondary | ICD-10-CM

## 2018-08-26 ENCOUNTER — Encounter

## 2018-08-26 ENCOUNTER — Encounter: Payer: Medicare Other | Attending: Psychology | Admitting: Psychology

## 2018-08-26 DIAGNOSIS — R413 Other amnesia: Secondary | ICD-10-CM

## 2018-09-05 ENCOUNTER — Encounter: Payer: Self-pay | Admitting: Psychology
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
## 2018-09-20 ENCOUNTER — Ambulatory Visit: Payer: Medicare Other | Admitting: Psychology

## 2018-09-27 ENCOUNTER — Encounter: Payer: Self-pay | Admitting: Psychology

## 2018-09-27 ENCOUNTER — Encounter: Payer: Medicare Other | Attending: Psychology | Admitting: Psychology

## 2018-09-27 DIAGNOSIS — R413 Other amnesia: Secondary | ICD-10-CM

## 2018-09-27 NOTE — Progress Notes (Signed)
BEHAVIOR OBSERVATIONS: Patient was on time to her 8:00am appointment. She was administered the Repeatable Battery for the Assessment of Neuropsychological Status (Form A). The appointment lasted 2 hours. Her participation was indicative of appropriate and redirectable behaviors.  She was appropriately dressed and well groomed. She displayed an appropriate level of cooperation and motivation. Mood was anxious but mostly euthymic with appropriate affect.  Thought processes were goal directed and linear. She became sensitive to the light within the medical office and testing room and required UV protecting sunglasses throughout the evaluation.   Results:   Measure Standard Score/ Scaled Score Percentile Description  Immediate Memory 94 34 Average  List Learning 12 75 Average  Story Memory 6 9 Low Average  Visuosptail/Constructional 84 14 Low Average  Figure Copy 8 25 Average  Line Orientation - 17-25 Low Average-Average  Language 87 19 Low Average  Picture Naming - 51-75 Average  Semantic Fluency 13 84 High Average  Attention 94 34 Average  Digit Span 6 9 Low Average  Coding 6 9 Low Average  Delayed Memory 95 37 Average  List Recall - 51-75 Average  List Recognition - 51-75 Average  Story Recall 7 16 Low Average  Figure Recall 7 16 Low Average  Total  86 18 Low Average

## 2018-10-01 ENCOUNTER — Ambulatory Visit: Payer: BC Managed Care – PPO | Admitting: Psychology

## 2018-10-19 ENCOUNTER — Encounter: Payer: Self-pay | Admitting: Psychology

## 2018-10-19 ENCOUNTER — Encounter: Payer: Medicare Other | Admitting: Psychology

## 2018-10-19 ENCOUNTER — Encounter: Payer: Medicare Other | Attending: Psychology | Admitting: Psychology

## 2018-10-19 DIAGNOSIS — R419 Unspecified symptoms and signs involving cognitive functions and awareness: Secondary | ICD-10-CM

## 2018-10-19 DIAGNOSIS — R413 Other amnesia: Secondary | ICD-10-CM | POA: Diagnosis not present
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
## 2018-10-25 ENCOUNTER — Encounter: Payer: Self-pay | Admitting: Psychology

## 2018-11-02 ENCOUNTER — Ambulatory Visit: Payer: Medicare Other | Admitting: Psychology

## 2018-11-19 ENCOUNTER — Ambulatory Visit: Payer: Medicare Other | Admitting: Psychology

## 2018-11-26 ENCOUNTER — Ambulatory Visit: Payer: Medicare Other | Admitting: Psychology

## 2018-12-02 ENCOUNTER — Ambulatory Visit: Payer: Medicare Other | Admitting: Psychology

## 2018-12-03 ENCOUNTER — Encounter

## 2018-12-03 ENCOUNTER — Ambulatory Visit: Payer: Medicare Other | Admitting: Psychology

## 2018-12-09 ENCOUNTER — Encounter

## 2018-12-09 ENCOUNTER — Encounter: Payer: Medicare Other | Attending: Psychology | Admitting: Psychology

## 2018-12-09 DIAGNOSIS — R419 Unspecified symptoms and signs involving cognitive functions and awareness: Secondary | ICD-10-CM | POA: Diagnosis not present

## 2018-12-09 DIAGNOSIS — R413 Other amnesia: Secondary | ICD-10-CM | POA: Diagnosis present

## 2018-12-11 ENCOUNTER — Encounter

## 2018-12-19 NOTE — Progress Notes (Signed)
Today I provided feedback regarding the results of the formal neuropsychological evaluation.  The full evaluation can be found on her appointment date on 11/10/2018.  Below is a copy of the summary of results and interpretations.    Summary of Results:                        Overall, the results of the current neuropsychological assessment are generally in the lower end of the average range and are not indicative of significant scatter or internal variability of performance.  All of her scores were within 10 points of each other as far as standard T scores.  The patient performed in the average range with regard to overall immediate memory, delayed memory, and attention/concentration.  The patient had low average performance with regard to expressive language abilities but individual subtest were generally quite good and in the average range.  The patient did very well with regard to verbal fluency measures.  The patient's visual spatial/visual constructional abilities were also in the low average range with no significant area of impairment and primarily the only weakness had to do with line orientation task but that was in the low average range.  Impression/Diagnosis:                     Overall, the specific pattern of strengths and weaknesses did not display any significant area of neuropsychological or cognitive deficits.  While the patient's global index score as well as specific individual index scores were below predicted levels based on her education and occupational history there were no indications of specific individual areas of cognitive deficits and all of her subtest performances were generally consistent with each other.  As this is the first assessment that has been done with the patient I did choose to use a neuropsychological test battery that is designed to be repeatable in nature.  If the patient does experience any changes are observed further impairments with regard to cognitive  function and memory we will allow for follow-up testing and will be able to make direct comparisons to her own performance versus comparisons to normative groups.  Diagnosis:                               Axis I: Cognitive complaints with normal neuropsychological exam   Hannah Christian, Psy.D. Neuropsychologist

## 2019-09-16 ENCOUNTER — Ambulatory Visit: Payer: Self-pay | Admitting: *Deleted

## 2019-09-16 NOTE — Telephone Encounter (Signed)
I was visiting a church Nov. 30th  I stood in a doorway and had a conversation with another person. We both wore mask.  Today I got notice that the church was closing until January for possible exposure to COVID-19. Should I be tested.   Patient denies symptoms. Patient was advised that she need to monitor for symptoms for full 14 days. I did not recommend testing after this amount of time but did give her the option to test.  She was give all the new information for scheduling and testing.  She verbalized understanding.  Reason for Disposition . Health Information question, no triage required and triager able to answer question  Answer Assessment - Initial Assessment Questions 1. REASON FOR CALL or QUESTION: "What is your reason for calling today?" or "How can I best help you?" or "What question do you have that I can help answer?"     I was visiting a church Nov. 30th  I stood in a doorway and had a conversation with another person. We both wore mask.  Today I got notice that the church was closing until January for possible exposure to COVID-19. Should I be tested for COVID-19  Protocols used: INFORMATION ONLY CALL - NO TRIAGE-A-AH

## 2020-02-29 ENCOUNTER — Other Ambulatory Visit: Payer: Self-pay | Admitting: Obstetrics and Gynecology

## 2020-02-29 DIAGNOSIS — R928 Other abnormal and inconclusive findings on diagnostic imaging of breast: Secondary | ICD-10-CM

## 2020-03-01 ENCOUNTER — Other Ambulatory Visit: Payer: Self-pay | Admitting: Obstetrics and Gynecology

## 2020-03-01 DIAGNOSIS — R928 Other abnormal and inconclusive findings on diagnostic imaging of breast: Secondary | ICD-10-CM

## 2020-03-14 ENCOUNTER — Ambulatory Visit
Admission: RE | Admit: 2020-03-14 | Discharge: 2020-03-14 | Disposition: A | Discharge status: Home / Self Care | Payer: Medicare Other | Source: Ambulatory Visit | Attending: Obstetrics and Gynecology | Admitting: Obstetrics and Gynecology

## 2020-03-14 ENCOUNTER — Other Ambulatory Visit: Payer: Self-pay

## 2020-03-14 ENCOUNTER — Ambulatory Visit
Admission: RE | Admit: 2020-03-14 | Discharge: 2020-03-14 | Disposition: A | Discharge status: Home / Self Care | Payer: Medicare PPO | Source: Ambulatory Visit | Attending: Obstetrics and Gynecology | Admitting: Obstetrics and Gynecology

## 2020-03-14 ENCOUNTER — Other Ambulatory Visit: Payer: Self-pay | Admitting: Obstetrics and Gynecology

## 2020-03-14 DIAGNOSIS — R928 Other abnormal and inconclusive findings on diagnostic imaging of breast: Secondary | ICD-10-CM

## 2020-03-14 DIAGNOSIS — N6489 Other specified disorders of breast: Secondary | ICD-10-CM | POA: Diagnosis not present

## 2020-06-28 DIAGNOSIS — I10 Essential (primary) hypertension: Secondary | ICD-10-CM | POA: Diagnosis not present

## 2020-06-28 DIAGNOSIS — E7800 Pure hypercholesterolemia, unspecified: Secondary | ICD-10-CM | POA: Diagnosis not present

## 2020-07-05 DIAGNOSIS — Z23 Encounter for immunization: Secondary | ICD-10-CM | POA: Diagnosis not present

## 2020-07-05 DIAGNOSIS — I10 Essential (primary) hypertension: Secondary | ICD-10-CM | POA: Diagnosis not present

## 2020-07-05 DIAGNOSIS — E7800 Pure hypercholesterolemia, unspecified: Secondary | ICD-10-CM | POA: Diagnosis not present

## 2020-07-05 DIAGNOSIS — Z0000 Encounter for general adult medical examination without abnormal findings: Secondary | ICD-10-CM | POA: Diagnosis not present

## 2020-07-05 DIAGNOSIS — M899 Disorder of bone, unspecified: Secondary | ICD-10-CM | POA: Diagnosis not present

## 2020-07-05 DIAGNOSIS — Z1389 Encounter for screening for other disorder: Secondary | ICD-10-CM | POA: Diagnosis not present

## 2020-07-05 DIAGNOSIS — K219 Gastro-esophageal reflux disease without esophagitis: Secondary | ICD-10-CM | POA: Diagnosis not present

## 2020-08-02 ENCOUNTER — Ambulatory Visit: Payer: Medicare PPO | Attending: Internal Medicine

## 2020-08-02 DIAGNOSIS — Z23 Encounter for immunization: Secondary | ICD-10-CM

## 2020-08-02 NOTE — Progress Notes (Signed)
   Covid-19 Vaccination Clinic  Name:  Hannah Christian    MRN: [id number] DOB: 1951/09/20  08/02/2020  Hannah Christian was observed post Covid-19 immunization for 15 minutes without incident. She was provided with Vaccine Information Sheet and instruction to access the V-Safe system.   Hannah Christian was instructed to call 911 with any severe reactions post vaccine: Marland Kitchen Difficulty breathing  . Swelling of face and throat  . A fast heartbeat  . A bad rash all over body  . Dizziness and weakness

## 2020-08-07 DIAGNOSIS — N644 Mastodynia: Secondary | ICD-10-CM | POA: Diagnosis not present

## 2020-08-16 ENCOUNTER — Other Ambulatory Visit: Payer: Self-pay | Admitting: Obstetrics and Gynecology

## 2020-08-16 DIAGNOSIS — N644 Mastodynia: Secondary | ICD-10-CM

## 2020-08-29 DIAGNOSIS — R350 Frequency of micturition: Secondary | ICD-10-CM | POA: Diagnosis not present

## 2020-08-29 DIAGNOSIS — R351 Nocturia: Secondary | ICD-10-CM | POA: Diagnosis not present

## 2020-09-06 DIAGNOSIS — F439 Reaction to severe stress, unspecified: Secondary | ICD-10-CM | POA: Diagnosis not present

## 2020-09-06 DIAGNOSIS — R519 Headache, unspecified: Secondary | ICD-10-CM | POA: Diagnosis not present

## 2020-09-06 DIAGNOSIS — I10 Essential (primary) hypertension: Secondary | ICD-10-CM | POA: Diagnosis not present

## 2020-09-06 DIAGNOSIS — G4700 Insomnia, unspecified: Secondary | ICD-10-CM | POA: Diagnosis not present

## 2020-09-14 ENCOUNTER — Ambulatory Visit
Admission: RE | Admit: 2020-09-14 | Discharge: 2020-09-14 | Disposition: A | Discharge status: Home / Self Care | Payer: Medicare PPO | Source: Ambulatory Visit | Attending: Obstetrics and Gynecology | Admitting: Obstetrics and Gynecology

## 2020-09-14 ENCOUNTER — Other Ambulatory Visit: Payer: Self-pay | Admitting: Obstetrics and Gynecology

## 2020-09-14 ENCOUNTER — Other Ambulatory Visit: Payer: Self-pay

## 2020-09-14 DIAGNOSIS — N644 Mastodynia: Secondary | ICD-10-CM | POA: Diagnosis not present

## 2020-09-14 DIAGNOSIS — N6489 Other specified disorders of breast: Secondary | ICD-10-CM

## 2020-09-25 ENCOUNTER — Ambulatory Visit
Admission: RE | Admit: 2020-09-25 | Discharge: 2020-09-25 | Disposition: A | Discharge status: Home / Self Care | Payer: Medicare PPO | Source: Ambulatory Visit | Attending: Obstetrics and Gynecology | Admitting: Obstetrics and Gynecology

## 2020-09-25 ENCOUNTER — Other Ambulatory Visit: Payer: Self-pay

## 2020-09-25 DIAGNOSIS — R928 Other abnormal and inconclusive findings on diagnostic imaging of breast: Secondary | ICD-10-CM | POA: Diagnosis not present

## 2020-09-25 DIAGNOSIS — N6489 Other specified disorders of breast: Secondary | ICD-10-CM | POA: Diagnosis not present

## 2020-10-01 ENCOUNTER — Other Ambulatory Visit: Payer: Medicare PPO

## 2020-10-01 DIAGNOSIS — Z20822 Contact with and (suspected) exposure to covid-19: Secondary | ICD-10-CM | POA: Diagnosis not present

## 2020-10-02 LAB — NOVEL CORONAVIRUS, NAA: SARS-CoV-2, NAA: NOT DETECTED

## 2020-10-02 LAB — SARS-COV-2, NAA 2 DAY TAT

## 2020-10-15 ENCOUNTER — Telehealth: Payer: Self-pay | Admitting: *Deleted

## 2020-10-15 NOTE — Telephone Encounter (Signed)
Patient given assistance with my chart.

## 2020-10-16 ENCOUNTER — Other Ambulatory Visit: Payer: Medicare PPO

## 2020-10-16 DIAGNOSIS — Z20822 Contact with and (suspected) exposure to covid-19: Secondary | ICD-10-CM | POA: Diagnosis not present

## 2020-10-18 LAB — NOVEL CORONAVIRUS, NAA: SARS-CoV-2, NAA: NOT DETECTED

## 2020-10-18 LAB — SARS-COV-2, NAA 2 DAY TAT

## 2020-11-09 ENCOUNTER — Ambulatory Visit: Payer: Self-pay | Admitting: Surgery

## 2020-11-09 ENCOUNTER — Other Ambulatory Visit: Payer: Self-pay | Admitting: Surgery

## 2020-11-09 DIAGNOSIS — N6489 Other specified disorders of breast: Secondary | ICD-10-CM

## 2020-11-09 NOTE — H&P (View-Only) (Signed)
History of Present Illness (Matthew K. Tsuei MD; 11/09/2020 12:25 PM) The patient is a 69 year old female who presents with a breast mass. Referred by Dr. Tomblin for right breast complex sclerosing lesion PCP - Dr. Candace Smith  This is a 69-year-old female with intermittent bilateral diffuse rest pain on a chronic basis who presents after a recent mammogram. This showed a subtle area of distortion of the lateral right breast. She will underwent further workup including an ultrasound that showed no sonographic correlate. She underwent stereotactic biopsy in the right lower outer quadrant which revealed a diagnosis of complex sclerosing lesion. She presents now to discuss surgical excision. No family history of breast cancer in first-degree relatives.   Problem List/Past Medical (Matthew K. 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 multiple Hysterectomy (not due to cancer) - Partial Oral Surgery  Diagnostic Studies History (Donyelle Alston, CNA; 11/09/2020 10:56 AM) Colonoscopy 1-5 years ago Mammogram within last year Pap Smear 1-5 years ago  Allergies (Donyelle Alston, CNA; 11/09/2020 10:57 AM) Codeine Phosphate *ANALGESICS - OPIOID* Codeine Sulfate *ANALGESICS - OPIOID* Capsule #0 Clear/Clear Veg *PHARMACEUTICAL ADJUVANTS* Capsule #0 White/White Opq Veg *PHARMACEUTICAL ADJUVANTS* Allergies Reconciled  Medication History (Donyelle Alston, CNA; 11/09/2020 10:57 AM) amLODIPine Besylate (2.5MG Tablet, Oral) Active. amLODIPine Besylate (5MG Tablet, Oral) Active. Pantoprazole Sodium (40MG Tablet DR, Oral) Active. Shingrix (50MCG/0.5ML For Suspension, Intramuscular) Active. IBU (800MG Tablet, Oral) Active. Medications Reconciled  Pregnancy / Birth History (Donyelle Alston, CNA; 11/09/2020 10:56 AM) Age at menarche 12 years. Age of menopause  >61 Contraceptive History Oral contraceptives. Gravida 0 Para 0  Other Problems (Matthew K. Tsuei, MD; 11/09/2020 12:25 PM) Gastroesophageal Reflux Disease Lump In Breast     Review of Systems (Donyelle Alston CNA; 11/09/2020 10:56 AM) General Not Present- Appetite Loss, Chills, Fatigue, Fever, Night Sweats, Weight Gain and Weight Loss. Skin Not Present- Change in Wart/Mole, Dryness, Hives, Jaundice, New Lesions, Non-Healing Wounds, Rash and Ulcer. HEENT Not Present- Earache, Hearing Loss, Hoarseness, Nose Bleed, Oral Ulcers, Ringing in the Ears, Seasonal Allergies, Sinus Pain, Sore Throat, Visual Disturbances, Wears glasses/contact lenses and Yellow Eyes. Breast Present- Breast Pain. Not Present- Breast Mass, Nipple Discharge and Skin Changes. Cardiovascular Not Present- Chest Pain, Difficulty Breathing Lying Down, Leg Cramps, Palpitations, Rapid Heart Rate, Shortness of Breath and Swelling of Extremities. Gastrointestinal Not Present- Abdominal Pain, Bloating, Bloody Stool, Change in Bowel Habits, Chronic diarrhea, Constipation, Difficulty Swallowing, Excessive gas, Gets full quickly at meals, Hemorrhoids, Indigestion, Nausea, Rectal Pain and Vomiting. Musculoskeletal Not Present- Back Pain, Joint Pain, Joint Stiffness, Muscle Pain, Muscle Weakness and Swelling of Extremities. Neurological Not Present- Decreased Memory, Fainting, Headaches, Numbness, Seizures, Tingling, Tremor, Trouble walking and Weakness. Psychiatric Not Present- Anxiety, Bipolar, Change in Sleep Pattern, Depression, Fearful and Frequent crying. Hematology Not Present- Blood Thinners, Easy Bruising, Excessive bleeding, Gland problems, HIV and Persistent Infections.  Vitals (Donyelle Alston CNA; 11/09/2020 10:58 AM) 11/09/2020 10:58 AM Weight: 180.38 lb Height: 67in Body Surface Area: 1.94 m Body Mass Index: 28.25 kg/m  Temp.: 97.9F  Pulse: 127 (Regular)  P.OX: 99% (Room air) BP: 140/60(Sitting, Left Arm,  Standard)        Physical Exam (Matthew K. Tsuei MD; 11/09/2020 12:25 PM)  The physical exam findings are as follows: Note:Constitutional: WDWN in NAD, conversant, no obvious deformities; resting comfortably Eyes: Pupils equal, round; sclera anicteric; moist conjunctiva; no lid lag HENT: Oral mucosa moist; good dentition   Neck: No masses palpated, trachea midline; no thyromegaly Lungs: CTA bilaterally; normal respiratory effort Breast: Symmetrical, no nipple changes, no axillary lymphadenopathy, no palpable masses in either breast. CV: Regular rate and rhythm; no murmurs; extremities well-perfused with no edema Abd: +bowel sounds, soft, non-tender, no palpable organomegaly; no palpable hernias Musc: Normal gait; no apparent clubbing or cyanosis in extremities Lymphatic: No palpable cervical or axillary lymphadenopathy Skin: Warm, dry; no sign of jaundice Psychiatric - alert and oriented x 4; calm mood and affect    Assessment & Plan Molli Hazard K. 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 The surgical procedure has been discussed with the patient. Potential risks, benefits, alternative treatments, and expected outcomes have been explained. All of the patient's questions at this time have been answered. The likelihood of reaching the patient's treatment goal is good. The patient understand the proposed surgical procedure and wishes to proceed.  Wilmon Arms. Corliss Skains, MD, [facility]  General/ Trauma Surgery   11/09/2020 12:26 PM

## 2020-11-09 NOTE — H&P (Signed)
History of Present Illness Wilmon Arms. Elna Radovich MD; 11/09/2020 12:25 PM) The patient is a 70 year old female who presents with a breast mass. Referred by Dr. Henderson Cloud for right breast complex sclerosing lesion PCP - Dr. Merri Brunette  This is a 70 year old female with intermittent bilateral diffuse rest pain on a chronic basis who presents after a recent mammogram. This showed a subtle area of distortion of the lateral right breast. She will underwent further workup including an ultrasound that showed no sonographic correlate. She underwent stereotactic biopsy in the right lower outer quadrant which revealed a diagnosis of complex sclerosing lesion. She presents now to discuss surgical excision. No family history of breast cancer in first-degree relatives.   Problem List/Past Medical Molli Hazard K. Vonnie Spagnolo, MD; 11/09/2020 12:25 PM) COMPLEX SCLEROSING LESION OF RIGHT BREAST (G76.18)  Past Surgical History Marliss Coots, CNA; 11/09/2020 10:56 AM) Breast Biopsy Right. multiple Hysterectomy (not due to cancer) - Partial Oral Surgery  Diagnostic Studies History Marliss Coots, CNA; 11/09/2020 10:56 AM) Colonoscopy 1-5 years ago Mammogram within last year Pap Smear 1-5 years ago  Allergies Marliss Coots, CNA; 11/09/2020 10:57 AM) Codeine Phosphate *ANALGESICS - OPIOID* Codeine Sulfate *ANALGESICS - OPIOID* Capsule #0 Clear/Clear Veg *PHARMACEUTICAL ADJUVANTS* Capsule #0 White/White Opq Veg *PHARMACEUTICAL ADJUVANTS* Allergies Reconciled  Medication History Marliss Coots, CNA; 11/09/2020 10:57 AM) amLODIPine Besylate (2.5MG  Tablet, Oral) Active. amLODIPine Besylate (5MG  Tablet, Oral) Active. Pantoprazole Sodium (40MG  Tablet DR, Oral) Active. Shingrix (50MCG/0.5ML For Suspension, Intramuscular) Active. IBU (800MG  Tablet, Oral) Active. Medications Reconciled  Pregnancy / Birth History , CNA; 11/09/2020 10:56 AM) Age at menarche 12 years. Age of menopause  >61 Contraceptive History Oral contraceptives. Gravida 0 Para 0  Other Problems . Phu Record, MD; 11/09/2020 12:25 PM) Gastroesophageal Reflux Disease Lump In Breast     Review of Systems [facility] Alston CNA; 11/09/2020 10:56 AM) General Not Present- Appetite Loss, Chills, Fatigue, Fever, Night Sweats, Weight Gain and Weight Loss. Skin Not Present- Change in Wart/Mole, Dryness, Hives, Jaundice, New Lesions, Non-Healing Wounds, Rash and Ulcer. HEENT Not Present- Earache, Hearing Loss, Hoarseness, Nose Bleed, Oral Ulcers, Ringing in the Ears, Seasonal Allergies, Sinus Pain, Sore Throat, Visual Disturbances, Wears glasses/contact lenses and Yellow Eyes. Breast Present- Breast Pain. Not Present- Breast Mass, Nipple Discharge and Skin Changes. Cardiovascular Not Present- Chest Pain, Difficulty Breathing Lying Down, Leg Cramps, Palpitations, Rapid Heart Rate, Shortness of Breath and Swelling of Extremities. Gastrointestinal Not Present- Abdominal Pain, Bloating, Bloody Stool, Change in Bowel Habits, Chronic diarrhea, Constipation, Difficulty Swallowing, Excessive gas, Gets full quickly at meals, Hemorrhoids, Indigestion, Nausea, Rectal Pain and Vomiting. Musculoskeletal Not Present- Back Pain, Joint Pain, Joint Stiffness, Muscle Pain, Muscle Weakness and Swelling of Extremities. Neurological Not Present- Decreased Memory, Fainting, Headaches, Numbness, Seizures, Tingling, Tremor, Trouble walking and Weakness. Psychiatric Not Present- Anxiety, Bipolar, Change in Sleep Pattern, Depression, Fearful and Frequent crying. Hematology Not Present- Blood Thinners, Easy Bruising, Excessive bleeding, Gland problems, HIV and Persistent Infections.  Vitals (Donyelle Alston CNA; 11/09/2020 10:58 AM) 11/09/2020 10:58 AM Weight: 180.38 lb Height: 67in Body Surface Area: 1.94 m Body Mass Index: 28.25 kg/m  Temp.: 97.11F  Pulse: 127 (Regular)  P.OX: 99% (Room air) BP: 140/60(Sitting, Left Arm,  Standard)        Physical Exam 01/07/2021 K. Cherree Conerly MD; 11/09/2020 12:25 PM)  The physical exam findings are as follows: Note:Constitutional: WDWN in NAD, conversant, no obvious deformities; resting comfortably Eyes: Pupils equal, round; sclera anicteric; moist conjunctiva; no lid lag HENT: Oral mucosa moist; good dentition  Neck: No masses palpated, trachea midline; no thyromegaly Lungs: CTA bilaterally; normal respiratory effort Breast: Symmetrical, no nipple changes, no axillary lymphadenopathy, no palpable masses in either breast. CV: Regular rate and rhythm; no murmurs; extremities well-perfused with no edema Abd: +bowel sounds, soft, non-tender, no palpable organomegaly; no palpable hernias Musc: Normal gait; no apparent clubbing or cyanosis in extremities Lymphatic: No palpable cervical or axillary lymphadenopathy Skin: Warm, dry; no sign of jaundice Psychiatric - alert and oriented x 4; calm mood and affect    Assessment & Plan Molli Hazard K. 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 The surgical procedure has been discussed with the patient. Potential risks, benefits, alternative treatments, and expected outcomes have been explained. All of the patient's questions at this time have been answered. The likelihood of reaching the patient's treatment goal is good. The patient understand the proposed surgical procedure and wishes to proceed.  Wilmon Arms. Corliss Skains, MD, [facility]  General/ Trauma Surgery   11/09/2020 12:26 PM

## 2020-11-13 DIAGNOSIS — I10 Essential (primary) hypertension: Secondary | ICD-10-CM | POA: Diagnosis not present

## 2020-11-14 ENCOUNTER — Other Ambulatory Visit: Payer: Self-pay | Admitting: Surgery

## 2020-11-14 DIAGNOSIS — N6489 Other specified disorders of breast: Secondary | ICD-10-CM

## 2020-11-21 ENCOUNTER — Other Ambulatory Visit: Payer: Self-pay

## 2020-11-21 ENCOUNTER — Encounter (HOSPITAL_BASED_OUTPATIENT_CLINIC_OR_DEPARTMENT_OTHER): Payer: Self-pay | Admitting: Surgery

## 2020-11-23 ENCOUNTER — Other Ambulatory Visit (HOSPITAL_COMMUNITY)
Admission: RE | Admit: 2020-11-23 | Discharge: 2020-11-23 | Disposition: A | Discharge status: Home / Self Care | Payer: Medicare PPO | Source: Ambulatory Visit | Attending: Surgery | Admitting: Surgery

## 2020-11-23 DIAGNOSIS — Z20822 Contact with and (suspected) exposure to covid-19: Secondary | ICD-10-CM | POA: Insufficient documentation

## 2020-11-23 DIAGNOSIS — Z01812 Encounter for preprocedural laboratory examination: Secondary | ICD-10-CM | POA: Insufficient documentation

## 2020-11-24 LAB — SARS CORONAVIRUS 2 (TAT 6-24 HRS): SARS Coronavirus 2: NEGATIVE

## 2020-11-26 ENCOUNTER — Ambulatory Visit
Admission: RE | Admit: 2020-11-26 | Discharge: 2020-11-26 | Disposition: A | Discharge status: Home / Self Care | Payer: Medicare PPO | Source: Ambulatory Visit | Attending: Surgery | Admitting: Surgery

## 2020-11-26 ENCOUNTER — Other Ambulatory Visit: Payer: Self-pay

## 2020-11-26 DIAGNOSIS — N6489 Other specified disorders of breast: Secondary | ICD-10-CM

## 2020-11-26 DIAGNOSIS — R928 Other abnormal and inconclusive findings on diagnostic imaging of breast: Secondary | ICD-10-CM | POA: Diagnosis not present

## 2020-11-27 ENCOUNTER — Encounter (HOSPITAL_BASED_OUTPATIENT_CLINIC_OR_DEPARTMENT_OTHER)
Admission: RE | Disposition: A | Discharge status: Home / Self Care | Payer: Self-pay | Source: Home / Self Care | Attending: Surgery

## 2020-11-27 ENCOUNTER — Ambulatory Visit (HOSPITAL_BASED_OUTPATIENT_CLINIC_OR_DEPARTMENT_OTHER): Payer: Medicare PPO | Admitting: Anesthesiology

## 2020-11-27 ENCOUNTER — Ambulatory Visit
Admission: RE | Admit: 2020-11-27 | Discharge: 2020-11-27 | Disposition: A | Discharge status: Home / Self Care | Payer: Medicare PPO | Source: Ambulatory Visit | Attending: Surgery | Admitting: Surgery

## 2020-11-27 ENCOUNTER — Encounter (HOSPITAL_BASED_OUTPATIENT_CLINIC_OR_DEPARTMENT_OTHER): Payer: Self-pay | Admitting: Surgery

## 2020-11-27 ENCOUNTER — Other Ambulatory Visit: Payer: Self-pay

## 2020-11-27 ENCOUNTER — Ambulatory Visit (HOSPITAL_BASED_OUTPATIENT_CLINIC_OR_DEPARTMENT_OTHER)
Admission: RE | Admit: 2020-11-27 | Discharge: 2020-11-27 | Disposition: A | Discharge status: Home / Self Care | Payer: Medicare PPO | Attending: Surgery | Admitting: Surgery

## 2020-11-27 DIAGNOSIS — Z79899 Other long term (current) drug therapy: Secondary | ICD-10-CM | POA: Diagnosis not present

## 2020-11-27 DIAGNOSIS — N6091 Unspecified benign mammary dysplasia of right breast: Secondary | ICD-10-CM | POA: Diagnosis not present

## 2020-11-27 DIAGNOSIS — Z885 Allergy status to narcotic agent status: Secondary | ICD-10-CM | POA: Insufficient documentation

## 2020-11-27 DIAGNOSIS — R928 Other abnormal and inconclusive findings on diagnostic imaging of breast: Secondary | ICD-10-CM | POA: Diagnosis not present

## 2020-11-27 DIAGNOSIS — E785 Hyperlipidemia, unspecified: Secondary | ICD-10-CM | POA: Diagnosis not present

## 2020-11-27 DIAGNOSIS — N6489 Other specified disorders of breast: Secondary | ICD-10-CM | POA: Insufficient documentation

## 2020-11-27 DIAGNOSIS — I10 Essential (primary) hypertension: Secondary | ICD-10-CM | POA: Diagnosis not present

## 2020-11-27 DIAGNOSIS — N6081 Other benign mammary dysplasias of right breast: Secondary | ICD-10-CM | POA: Diagnosis not present

## 2020-11-27 DIAGNOSIS — N6011 Diffuse cystic mastopathy of right breast: Secondary | ICD-10-CM | POA: Diagnosis not present

## 2020-11-27 DIAGNOSIS — K219 Gastro-esophageal reflux disease without esophagitis: Secondary | ICD-10-CM | POA: Diagnosis not present

## 2020-11-27 HISTORY — DX: Unspecified osteoarthritis, unspecified site: M19.90

## 2020-11-27 HISTORY — PX: BREAST LUMPECTOMY WITH RADIOACTIVE SEED LOCALIZATION: SHX6424

## 2020-11-27 HISTORY — DX: Anxiety disorder, unspecified: F41.9

## 2020-11-27 HISTORY — DX: Essential (primary) hypertension: I10

## 2020-11-27 HISTORY — DX: Other specified disorders of breast: N64.89

## 2020-11-27 SURGERY — BREAST LUMPECTOMY WITH RADIOACTIVE SEED LOCALIZATION
Anesthesia: General | Site: Breast | Laterality: Right

## 2020-11-27 MED ORDER — FENTANYL CITRATE (PF) 100 MCG/2ML IJ SOLN
INTRAMUSCULAR | Status: AC
  Filled 2020-11-27: qty 2

## 2020-11-27 MED ORDER — LIDOCAINE 2% (20 MG/ML) 5 ML SYRINGE
INTRAMUSCULAR | Status: AC
  Filled 2020-11-27: qty 5

## 2020-11-27 MED ORDER — CEFAZOLIN SODIUM-DEXTROSE 2-4 GM/100ML-% IV SOLN
2.0000 g | INTRAVENOUS | Status: AC
  Administered 2020-11-27: 2 g via INTRAVENOUS

## 2020-11-27 MED ORDER — GLYCOPYRROLATE PF 0.2 MG/ML IJ SOSY
PREFILLED_SYRINGE | INTRAMUSCULAR | Status: AC
  Filled 2020-11-27: qty 1

## 2020-11-27 MED ORDER — ONDANSETRON HCL 4 MG/2ML IJ SOLN
INTRAMUSCULAR | Status: AC
  Filled 2020-11-27: qty 2

## 2020-11-27 MED ORDER — OXYCODONE HCL 5 MG/5ML PO SOLN: 5.0000 mg | Freq: Once | ORAL | Status: DC | PRN

## 2020-11-27 MED ORDER — PROPOFOL 10 MG/ML IV BOLUS
INTRAVENOUS | Status: AC
  Filled 2020-11-27: qty 40

## 2020-11-27 MED ORDER — PHENYLEPHRINE HCL (PRESSORS) 10 MG/ML IV SOLN
INTRAVENOUS | Status: DC | PRN
  Administered 2020-11-27 (×2): 80 ug via INTRAVENOUS
  Administered 2020-11-27: 120 ug via INTRAVENOUS

## 2020-11-27 MED ORDER — PHENYLEPHRINE 40 MCG/ML (10ML) SYRINGE FOR IV PUSH (FOR BLOOD PRESSURE SUPPORT)
PREFILLED_SYRINGE | INTRAVENOUS | Status: AC
  Filled 2020-11-27: qty 10

## 2020-11-27 MED ORDER — GABAPENTIN 300 MG PO CAPS
ORAL_CAPSULE | ORAL | Status: AC
  Filled 2020-11-27: qty 1

## 2020-11-27 MED ORDER — CHLORHEXIDINE GLUCONATE CLOTH 2 % EX PADS: 6.0000 | MEDICATED_PAD | Freq: Once | CUTANEOUS | Status: DC

## 2020-11-27 MED ORDER — FENTANYL CITRATE (PF) 250 MCG/5ML IJ SOLN
INTRAMUSCULAR | Status: DC | PRN
  Administered 2020-11-27: 50 ug via INTRAVENOUS

## 2020-11-27 MED ORDER — DEXAMETHASONE SODIUM PHOSPHATE 10 MG/ML IJ SOLN
INTRAMUSCULAR | Status: AC
  Filled 2020-11-27: qty 1

## 2020-11-27 MED ORDER — GABAPENTIN 300 MG PO CAPS
300.0000 mg | ORAL_CAPSULE | ORAL | Status: AC
  Administered 2020-11-27: 300 mg via ORAL

## 2020-11-27 MED ORDER — LIDOCAINE HCL (CARDIAC) PF 100 MG/5ML IV SOSY
PREFILLED_SYRINGE | INTRAVENOUS | Status: DC | PRN
  Administered 2020-11-27: 60 mg via INTRATRACHEAL

## 2020-11-27 MED ORDER — KETOROLAC TROMETHAMINE 30 MG/ML IJ SOLN: 30.0000 mg | Freq: Once | INTRAMUSCULAR | Status: DC | PRN

## 2020-11-27 MED ORDER — ACETAMINOPHEN 500 MG PO TABS
ORAL_TABLET | ORAL | Status: AC
  Filled 2020-11-27: qty 2

## 2020-11-27 MED ORDER — MIDAZOLAM HCL 2 MG/2ML IJ SOLN
INTRAMUSCULAR | Status: AC
  Filled 2020-11-27: qty 2

## 2020-11-27 MED ORDER — DEXAMETHASONE SODIUM PHOSPHATE 10 MG/ML IJ SOLN
INTRAMUSCULAR | Status: DC | PRN
  Administered 2020-11-27: 8 mg via INTRAVENOUS

## 2020-11-27 MED ORDER — CEFAZOLIN SODIUM-DEXTROSE 2-4 GM/100ML-% IV SOLN
INTRAVENOUS | Status: AC
  Filled 2020-11-27: qty 100

## 2020-11-27 MED ORDER — PROPOFOL 10 MG/ML IV BOLUS
INTRAVENOUS | Status: DC | PRN
  Administered 2020-11-27: 150 mg via INTRAVENOUS

## 2020-11-27 MED ORDER — LACTATED RINGERS IV SOLN: INTRAVENOUS | Status: DC

## 2020-11-27 MED ORDER — MIDAZOLAM HCL 2 MG/2ML IJ SOLN
INTRAMUSCULAR | Status: DC | PRN
  Administered 2020-11-27: 2 mg via INTRAVENOUS

## 2020-11-27 MED ORDER — ONDANSETRON HCL 4 MG/2ML IJ SOLN
INTRAMUSCULAR | Status: DC | PRN
  Administered 2020-11-27: 4 mg via INTRAVENOUS

## 2020-11-27 MED ORDER — BUPIVACAINE-EPINEPHRINE 0.25% -1:200000 IJ SOLN
INTRAMUSCULAR | Status: DC | PRN
  Administered 2020-11-27: 10 mL

## 2020-11-27 MED ORDER — HYDROMORPHONE HCL 1 MG/ML IJ SOLN: 0.2500 mg | INTRAMUSCULAR | Status: DC | PRN

## 2020-11-27 MED ORDER — PROMETHAZINE HCL 25 MG/ML IJ SOLN
6.2500 mg | INTRAMUSCULAR | Status: DC | PRN
Start: 2020-11-27 — End: 2020-11-27

## 2020-11-27 MED ORDER — EPHEDRINE 5 MG/ML INJ
INTRAVENOUS | Status: AC
  Filled 2020-11-27: qty 10

## 2020-11-27 MED ORDER — OXYCODONE HCL 5 MG PO TABS: 5.0000 mg | ORAL_TABLET | Freq: Once | ORAL | Status: DC | PRN

## 2020-11-27 MED ORDER — MEPERIDINE HCL 25 MG/ML IJ SOLN: 6.2500 mg | INTRAMUSCULAR | Status: DC | PRN

## 2020-11-27 MED ORDER — LACTATED RINGERS IV SOLN: INTRAVENOUS | Status: DC | PRN

## 2020-11-27 MED ORDER — ACETAMINOPHEN 500 MG PO TABS
1000.0000 mg | ORAL_TABLET | ORAL | Status: AC
  Administered 2020-11-27: 1000 mg via ORAL

## 2020-11-27 SURGICAL SUPPLY — 52 items
APL PRP STRL LF DISP 70% ISPRP (MISCELLANEOUS) ×1
APL SKNCLS STERI-STRIP NONHPOA (GAUZE/BANDAGES/DRESSINGS) ×1
APPLIER CLIP 9.375 MED OPEN (MISCELLANEOUS) ×2
APR CLP MED 9.3 20 MLT OPN (MISCELLANEOUS) ×1
BENZOIN TINCTURE PRP APPL 2/3 (GAUZE/BANDAGES/DRESSINGS) ×2 IMPLANT
BLADE HEX COATED 2.75 (ELECTRODE) ×2 IMPLANT
BLADE SURG 15 STRL LF DISP TIS (BLADE) ×1 IMPLANT
BLADE SURG 15 STRL SS (BLADE) ×2
CANISTER SUC SOCK COL 7IN (MISCELLANEOUS) IMPLANT
CANISTER SUCT 1200ML W/VALVE (MISCELLANEOUS) IMPLANT
CHLORAPREP W/TINT 26 (MISCELLANEOUS) ×2 IMPLANT
CLIP APPLIE 9.375 MED OPEN (MISCELLANEOUS) ×1 IMPLANT
COVER BACK TABLE 60X90IN (DRAPES) ×2 IMPLANT
COVER MAYO STAND STRL (DRAPES) ×2 IMPLANT
COVER PROBE W GEL 5X96 (DRAPES) ×2 IMPLANT
COVER WAND RF STERILE (DRAPES) IMPLANT
DECANTER SPIKE VIAL GLASS SM (MISCELLANEOUS) IMPLANT
DRAPE LAPAROTOMY 100X72 PEDS (DRAPES) ×2 IMPLANT
DRAPE UTILITY XL STRL (DRAPES) ×2 IMPLANT
DRSG TEGADERM 4X4.75 (GAUZE/BANDAGES/DRESSINGS) ×2 IMPLANT
ELECT REM PT RETURN 9FT ADLT (ELECTROSURGICAL) ×2
ELECTRODE REM PT RTRN 9FT ADLT (ELECTROSURGICAL) ×1 IMPLANT
GAUZE SPONGE 4X4 12PLY STRL LF (GAUZE/BANDAGES/DRESSINGS) IMPLANT
GLOVE SURG ENC MOIS LTX SZ6.5 (GLOVE) ×1 IMPLANT
GLOVE SURG ENC MOIS LTX SZ7 (GLOVE) ×2 IMPLANT
GLOVE SURG UNDER POLY LF SZ7 (GLOVE) ×1 IMPLANT
GLOVE SURG UNDER POLY LF SZ7.5 (GLOVE) ×2 IMPLANT
GOWN STRL REUS W/ TWL LRG LVL3 (GOWN DISPOSABLE) ×2 IMPLANT
GOWN STRL REUS W/TWL LRG LVL3 (GOWN DISPOSABLE) ×4
ILLUMINATOR WAVEGUIDE N/F (MISCELLANEOUS) IMPLANT
KIT MARKER MARGIN INK (KITS) ×2 IMPLANT
LIGHT WAVEGUIDE WIDE FLAT (MISCELLANEOUS) IMPLANT
NDL HYPO 25X1 1.5 SAFETY (NEEDLE) ×1 IMPLANT
NEEDLE HYPO 25X1 1.5 SAFETY (NEEDLE) ×2 IMPLANT
NS IRRIG 1000ML POUR BTL (IV SOLUTION) ×2 IMPLANT
PACK BASIN DAY SURGERY FS (CUSTOM PROCEDURE TRAY) ×2 IMPLANT
PENCIL SMOKE EVACUATOR (MISCELLANEOUS) ×2 IMPLANT
SLEEVE SCD COMPRESS KNEE MED (MISCELLANEOUS) ×2 IMPLANT
SPONGE GAUZE 2X2 8PLY STRL LF (GAUZE/BANDAGES/DRESSINGS) IMPLANT
SPONGE LAP 18X18 RF (DISPOSABLE) IMPLANT
SPONGE LAP 4X18 RFD (DISPOSABLE) ×2 IMPLANT
STRIP CLOSURE SKIN 1/2X4 (GAUZE/BANDAGES/DRESSINGS) ×2 IMPLANT
SUT MON AB 4-0 PC3 18 (SUTURE) ×2 IMPLANT
SUT SILK 2 0 SH (SUTURE) IMPLANT
SUT VIC AB 3-0 SH 27 (SUTURE) ×2
SUT VIC AB 3-0 SH 27X BRD (SUTURE) ×1 IMPLANT
SYR BULB EAR ULCER 3OZ GRN STR (SYRINGE) IMPLANT
SYR CONTROL 10ML LL (SYRINGE) ×2 IMPLANT
TOWEL GREEN STERILE FF (TOWEL DISPOSABLE) ×2 IMPLANT
TRAY FAXITRON CT DISP (TRAY / TRAY PROCEDURE) ×2 IMPLANT
TUBE CONNECTING 20X1/4 (TUBING) IMPLANT
YANKAUER SUCT BULB TIP NO VENT (SUCTIONS) IMPLANT

## 2020-11-27 NOTE — Discharge Instructions (Signed)
Post Anesthesia Home Care Instructions  Activity: Get plenty of rest for the remainder of the day. A responsible individual must stay with you for 24 hours following the procedure.  For the next 24 hours, DO NOT: -Drive a car -Advertising copywriter -Drink alcoholic beverages -Take any medication unless instructed by your physician -Make any legal decisions or sign important papers.  Meals: Start with liquid foods such as gelatin or soup. Progress to regular foods as tolerated. Avoid greasy, spicy, heavy foods. If nausea and/or vomiting occur, drink only clear liquids until the nausea and/or vomiting subsides. Call your physician if vomiting continues.  Special Instructions/Symptoms: Your throat may feel dry or sore from the anesthesia or the breathing tube placed in your throat during surgery. If this causes discomfort, gargle with warm salt water. The discomfort should disappear within 24 hours.  If you had a scopolamine patch placed behind your ear for the management of post- operative nausea and/or vomiting:  1. The medication in the patch is effective for 72 hours, after which it should be removed.  Wrap patch in a tissue and discard in the trash. Wash hands thoroughly with soap and water. 2. You may remove the patch earlier than 72 hours if you experience unpleasant side effects which may include dry mouth, dizziness or visual disturbances. 3. Avoid touching the patch. Wash your hands with soap and water after contact with the patch.    [facility] Office Phone Number [phone]    Next dose of tylenol may be given at 3p  BREAST BIOPSY/ PARTIAL MASTECTOMY: POST OP INSTRUCTIONS  Always review your discharge instruction sheet given to you by the facility where your surgery was performed.  IF YOU HAVE DISABILITY OR FAMILY LEAVE FORMS, YOU MUST BRING THEM TO THE OFFICE FOR PROCESSING.  DO NOT GIVE THEM TO YOUR DOCTOR.  1. A prescription for pain medication may be  given to you upon discharge.  Take your pain medication as prescribed, if needed.  If narcotic pain medicine is not needed, then you may take acetaminophen (Tylenol) or ibuprofen (Advil) as needed. 2. Take your usually prescribed medications unless otherwise directed 3. If you need a refill on your pain medication, please contact your pharmacy.  They will contact our office to request authorization.  Prescriptions will not be filled after 5pm or on week-ends. 4. You should eat very light the first 24 hours after surgery, such as soup, crackers, pudding, etc.  Resume your normal diet the day after surgery. 5. Most patients will experience some swelling and bruising in the breast.  Ice packs and a good support bra will help.  Swelling and bruising can take several days to resolve.  6. It is common to experience some constipation if taking pain medication after surgery.  Increasing fluid intake and taking a stool softener will usually help or prevent this problem from occurring.  A mild laxative (Milk of Magnesia or Miralax) should be taken according to package directions if there are no bowel movements after 48 hours. 7. Unless discharge instructions indicate otherwise, you may remove your bandages 24-48 hours after surgery, and you may shower at that time.  You may have steri-strips (small skin tapes) in place directly over the incision.  These strips should be left on the skin for 7-10 days.  If your surgeon used skin glue on the incision, you may shower in 24 hours.  The glue will flake off over the next 2-3 weeks.  Any sutures or staples will  be removed at the office during your follow-up visit. 8. ACTIVITIES:  You may resume regular daily activities (gradually increasing) beginning the next day.  Wearing a good support bra or sports bra minimizes pain and swelling.  You may have sexual intercourse when it is comfortable. a. You may drive when you no longer are taking prescription pain medication, you can  comfortably wear a seatbelt, and you can safely maneuver your car and apply brakes. b. RETURN TO WORK:  ______________________________________________________________________________________ 9. You should see your doctor in the office for a follow-up appointment approximately two weeks after your surgery.  Your doctor's nurse will typically make your follow-up appointment when she calls you with your pathology report.  Expect your pathology report 2-3 business days after your surgery.  You may call to check if you do not hear from [place] after three days. 10. OTHER INSTRUCTIONS: _______________________________________________________________________________________________ _____________________________________________________________________________________________________________________________________ _____________________________________________________________________________________________________________________________________ _____________________________________________________________________________________________________________________________________  WHEN TO CALL YOUR DOCTOR: 1. Fever over 101.0 2. Nausea and/or vomiting. 3. Extreme swelling or bruising. 4. Continued bleeding from incision. 5. Increased pain, redness, or drainage from the incision.  The clinic staff is available to answer your questions during regular business hours.  Please don't hesitate to call and ask to speak to one of the nurses for clinical concerns.  If you have a medical emergency, go to the nearest emergency room or call 911.  A surgeon from [facility] is always on call at the hospital.  For further questions, please visit [email or web address]

## 2020-11-27 NOTE — Anesthesia Postprocedure Evaluation (Signed)
Anesthesia Post Note  Patient: Hannah Christian  Procedure(s) Performed: RIGHT BREAST LUMPECTOMY WITH RADIOACTIVE SEED LOCALIZATION (Right Breast)     Patient location during evaluation: PACU Anesthesia Type: General Level of consciousness: awake and alert, oriented and patient cooperative Pain management: pain level controlled Vital Signs Assessment: post-procedure vital signs reviewed and stable Respiratory status: spontaneous breathing, nonlabored ventilation and respiratory function stable Cardiovascular status: blood pressure returned to baseline and stable Postop Assessment: no apparent nausea or vomiting Anesthetic complications: no   No complications documented.  Last Vitals:  Vitals:   11/27/20 0909 11/27/20 0915  BP: (!) 86/45 (!) 86/45  Pulse: 73 70  Resp: 13 15  Temp: 36.6 C   SpO2: 100% 100%    Last Pain:  Vitals:   11/27/20 0909  TempSrc:   PainSc: Asleep                 Lannie Fields

## 2020-11-27 NOTE — Op Note (Signed)
Pre-op Diagnosis:  Right breast complex sclerosing lesion Post-op Diagnosis: same Procedure:  Right radioactive seed localized lumpectomy Surgeon:  Graycen Degan K. Anesthesia:  GEN - LMA Indications:  This is a 70 year old female with intermittent bilateral diffuse rest pain on a chronic basis who presents after a recent mammogram. This showed a subtle area of distortion of the lateral right breast. She will underwent further workup including an ultrasound that showed no sonographic correlate. She underwent stereotactic biopsy in the right lower outer quadrant which revealed a diagnosis of complex sclerosing lesion. She presents now to discuss surgical excision.  Description of procedure: The patient is brought to the operating room placed in supine position on the operating room table. After an adequate level of general anesthesia was obtained, her right breast was prepped with ChloraPrep and draped in sterile fashion. A timeout was taken to ensure the proper patient and proper procedure. We interrogated the breast with the neoprobe. We made a low transverse incision in the RLOQ after infiltrating with 0.25% Marcaine. Dissection was carried down in the breast tissue with cautery. We used the neoprobe to guide [place] towards the radioactive seed. We excised an area of tissue around the radioactive seed 1.5 cm in diameter. The specimen was removed and was oriented with a paint kit. Specimen mammogram showed the radioactive seed as well as the biopsy clip within the specimen. This was sent for pathologic examination. There is no residual radioactivity within the biopsy cavity. We inspected carefully for hemostasis. The wound was thoroughly irrigated. The wound was closed with a deep layer of 3-0 Vicryl and a subcuticular layer of 4-0 Monocryl. Benzoin Steri-Strips were applied. The patient was then extubated and brought to the recovery room in stable condition. All sponge, instrument, and needle counts are  correct.  Imogene Burn. Georgette Dover, MD, [facility]  General/ Trauma Surgery  11/27/2020 9:09 AM

## 2020-11-27 NOTE — Anesthesia Procedure Notes (Signed)
Procedure Name: LMA Insertion Date/Time: 11/27/2020 8:44 AM Performed by: Modena Morrow, CRNA Pre-anesthesia Checklist: Patient identified, Emergency Drugs available, Suction available and Patient being monitored Patient Re-evaluated:Patient Re-evaluated prior to induction Oxygen Delivery Method: Circle system utilized Preoxygenation: Pre-oxygenation with 100% oxygen Induction Type: IV induction Ventilation: Mask ventilation without difficulty LMA: LMA inserted LMA Size: 4.0 Placement Confirmation: ETT inserted through vocal cords under direct vision and breath sounds checked- equal and bilateral Tube secured with: Tape Dental Injury: Teeth and Oropharynx as per pre-operative assessment

## 2020-11-27 NOTE — Anesthesia Preprocedure Evaluation (Signed)
Anesthesia Evaluation  Patient identified by MRN, date of birth, ID band Patient awake    Reviewed: Allergy & Precautions, H&P , NPO status , Patient's Chart, lab work & pertinent test results  Airway Mallampati: II  TM Distance: >3 FB Neck ROM: Full    Dental no notable dental hx.    Pulmonary neg pulmonary ROS,    Pulmonary exam normal breath sounds clear to auscultation       Cardiovascular hypertension, Pt. on medications Normal cardiovascular exam Rhythm:Regular Rate:Normal     Neuro/Psych PSYCHIATRIC DISORDERS Anxiety negative neurological ROS     GI/Hepatic Neg liver ROS, hiatal hernia, GERD  Controlled and Medicated,  Endo/Other  negative endocrine ROS  Renal/GU negative Renal ROS  negative genitourinary   Musculoskeletal  (+) Arthritis , Osteoarthritis,    Abdominal   Peds negative pediatric ROS (+)  Hematology negative hematology ROS (+)   Anesthesia Other Findings Right breast sclerosing lesion   Reproductive/Obstetrics negative OB ROS                             Anesthesia Physical Anesthesia Plan  ASA: II  Anesthesia Plan: General   Post-op Pain Management:    Induction: Intravenous  PONV Risk Score and Plan: 3 and Ondansetron, Dexamethasone, Midazolam and Treatment may vary due to age or medical condition  Airway Management Planned: LMA  Additional Equipment: None  Intra-op Plan:   Post-operative Plan: Extubation in OR  Informed Consent: I have reviewed the patients History and Physical, chart, labs and discussed the procedure including the risks, benefits and alternatives for the proposed anesthesia with the patient or authorized representative who has indicated his/her understanding and acceptance.     Dental advisory given  Plan Discussed with: CRNA  Anesthesia Plan Comments:         Anesthesia Quick Evaluation

## 2020-11-27 NOTE — Transfer of Care (Signed)
Immediate Anesthesia Transfer of Care Note  Patient: Hannah Christian  Procedure(s) Performed: RIGHT BREAST LUMPECTOMY WITH RADIOACTIVE SEED LOCALIZATION (Right Breast)  Patient Location: PACU  Anesthesia Type:General  Level of Consciousness: sedated  Airway & Oxygen Therapy: Patient Spontanous Breathing and Patient connected to face mask oxygen  Post-op Assessment: Report given to RN and Post -op Vital signs reviewed and stable  Post vital signs: Reviewed and stable  Last Vitals:  Vitals Value Taken Time  BP 86/45 11/27/20 0909  Temp    Pulse 71 11/27/20 0911  Resp 13 11/27/20 0911  SpO2 100 % 11/27/20 0911  Vitals shown include unvalidated device data.  Last Pain:  Vitals:   11/27/20 0714  TempSrc: Oral  PainSc: 1       Patients Stated Pain Goal: 3 (11/27/20 5956)  Complications: No complications documented.

## 2020-11-27 NOTE — Interval H&P Note (Signed)
History and Physical Interval Note:  11/27/2020 7:15 AM  Hannah Christian  has presented today for surgery, with the diagnosis of RIGHT BREAST COMPLEX SCLEROSING LESION.  The various methods of treatment have been discussed with the patient and family. After consideration of risks, benefits and other options for treatment, the patient has consented to  Procedure(s) with comments: RIGHT BREAST LUMPECTOMY WITH RADIOACTIVE SEED LOCALIZATION (Right) - LMA; START TIME OF 8:30 AM FOR 60 MINUTES IN [facility] as a surgical intervention.  The patient's history has been reviewed, patient examined, no change in status, stable for surgery.  I have reviewed the patient's chart and labs.  Questions were answered to the patient's satisfaction.     Hannah Christian

## 2020-11-28 ENCOUNTER — Encounter (HOSPITAL_BASED_OUTPATIENT_CLINIC_OR_DEPARTMENT_OTHER): Payer: Self-pay | Admitting: Surgery

## 2020-11-28 LAB — SURGICAL PATHOLOGY

## 2020-11-28 NOTE — Addendum Note (Signed)
Addendum  created 11/28/20 1130 by Lance Coon, CRNA   Charge Capture section accepted

## 2020-12-11 DIAGNOSIS — R21 Rash and other nonspecific skin eruption: Secondary | ICD-10-CM | POA: Diagnosis not present

## 2020-12-11 DIAGNOSIS — I10 Essential (primary) hypertension: Secondary | ICD-10-CM | POA: Diagnosis not present

## 2021-01-31 DIAGNOSIS — S99922A Unspecified injury of left foot, initial encounter: Secondary | ICD-10-CM | POA: Diagnosis not present

## 2021-01-31 DIAGNOSIS — K219 Gastro-esophageal reflux disease without esophagitis: Secondary | ICD-10-CM | POA: Diagnosis not present

## 2021-01-31 DIAGNOSIS — R202 Paresthesia of skin: Secondary | ICD-10-CM | POA: Diagnosis not present

## 2021-01-31 DIAGNOSIS — R252 Cramp and spasm: Secondary | ICD-10-CM | POA: Diagnosis not present

## 2021-01-31 DIAGNOSIS — R21 Rash and other nonspecific skin eruption: Secondary | ICD-10-CM | POA: Diagnosis not present

## 2021-02-11 DIAGNOSIS — M79604 Pain in right leg: Secondary | ICD-10-CM | POA: Diagnosis not present

## 2021-02-11 DIAGNOSIS — L300 Nummular dermatitis: Secondary | ICD-10-CM | POA: Diagnosis not present

## 2021-02-11 DIAGNOSIS — L821 Other seborrheic keratosis: Secondary | ICD-10-CM | POA: Diagnosis not present

## 2021-02-12 ENCOUNTER — Other Ambulatory Visit: Payer: Self-pay | Admitting: Family Medicine

## 2021-02-12 DIAGNOSIS — M1711 Unilateral primary osteoarthritis, right knee: Secondary | ICD-10-CM | POA: Diagnosis not present

## 2021-02-12 DIAGNOSIS — M79604 Pain in right leg: Secondary | ICD-10-CM

## 2021-02-12 DIAGNOSIS — M7061 Trochanteric bursitis, right hip: Secondary | ICD-10-CM | POA: Diagnosis not present

## 2021-02-13 ENCOUNTER — Ambulatory Visit
Admission: RE | Admit: 2021-02-13 | Discharge: 2021-02-13 | Disposition: A | Discharge status: Home / Self Care | Payer: Medicare PPO | Source: Ambulatory Visit | Attending: Family Medicine | Admitting: Family Medicine

## 2021-02-13 DIAGNOSIS — R252 Cramp and spasm: Secondary | ICD-10-CM | POA: Diagnosis not present

## 2021-02-13 DIAGNOSIS — M25461 Effusion, right knee: Secondary | ICD-10-CM | POA: Diagnosis not present

## 2021-02-13 DIAGNOSIS — M79661 Pain in right lower leg: Secondary | ICD-10-CM | POA: Diagnosis not present

## 2021-02-13 DIAGNOSIS — M79604 Pain in right leg: Secondary | ICD-10-CM

## 2021-02-22 DIAGNOSIS — M1711 Unilateral primary osteoarthritis, right knee: Secondary | ICD-10-CM | POA: Diagnosis not present

## 2021-02-22 DIAGNOSIS — M7061 Trochanteric bursitis, right hip: Secondary | ICD-10-CM | POA: Diagnosis not present

## 2021-02-25 DIAGNOSIS — M7061 Trochanteric bursitis, right hip: Secondary | ICD-10-CM | POA: Diagnosis not present

## 2021-03-07 DIAGNOSIS — M7061 Trochanteric bursitis, right hip: Secondary | ICD-10-CM | POA: Diagnosis not present

## 2021-03-07 DIAGNOSIS — M1711 Unilateral primary osteoarthritis, right knee: Secondary | ICD-10-CM | POA: Diagnosis not present

## 2021-03-11 DIAGNOSIS — M7061 Trochanteric bursitis, right hip: Secondary | ICD-10-CM | POA: Diagnosis not present

## 2021-03-13 DIAGNOSIS — M7061 Trochanteric bursitis, right hip: Secondary | ICD-10-CM | POA: Diagnosis not present

## 2021-03-18 DIAGNOSIS — M7061 Trochanteric bursitis, right hip: Secondary | ICD-10-CM | POA: Diagnosis not present

## 2021-03-20 DIAGNOSIS — M7061 Trochanteric bursitis, right hip: Secondary | ICD-10-CM | POA: Diagnosis not present

## 2021-03-25 DIAGNOSIS — Z1231 Encounter for screening mammogram for malignant neoplasm of breast: Secondary | ICD-10-CM | POA: Diagnosis not present

## 2021-03-25 DIAGNOSIS — Z124 Encounter for screening for malignant neoplasm of cervix: Secondary | ICD-10-CM | POA: Diagnosis not present

## 2021-03-25 DIAGNOSIS — Z6828 Body mass index (BMI) 28.0-28.9, adult: Secondary | ICD-10-CM | POA: Diagnosis not present

## 2021-03-25 DIAGNOSIS — Z01419 Encounter for gynecological examination (general) (routine) without abnormal findings: Secondary | ICD-10-CM | POA: Diagnosis not present

## 2021-03-25 DIAGNOSIS — M7061 Trochanteric bursitis, right hip: Secondary | ICD-10-CM | POA: Diagnosis not present

## 2021-04-01 DIAGNOSIS — M5459 Other low back pain: Secondary | ICD-10-CM | POA: Diagnosis not present

## 2021-04-01 DIAGNOSIS — M5416 Radiculopathy, lumbar region: Secondary | ICD-10-CM | POA: Diagnosis not present

## 2021-04-02 DIAGNOSIS — Z20822 Contact with and (suspected) exposure to covid-19: Secondary | ICD-10-CM | POA: Diagnosis not present

## 2021-04-03 DIAGNOSIS — M7061 Trochanteric bursitis, right hip: Secondary | ICD-10-CM | POA: Diagnosis not present

## 2021-04-12 DIAGNOSIS — M1711 Unilateral primary osteoarthritis, right knee: Secondary | ICD-10-CM | POA: Diagnosis not present

## 2021-04-12 DIAGNOSIS — M25561 Pain in right knee: Secondary | ICD-10-CM | POA: Diagnosis not present

## 2021-04-18 DIAGNOSIS — M7061 Trochanteric bursitis, right hip: Secondary | ICD-10-CM | POA: Diagnosis not present

## 2021-04-23 DIAGNOSIS — M545 Low back pain, unspecified: Secondary | ICD-10-CM | POA: Diagnosis not present

## 2021-04-30 DIAGNOSIS — M5459 Other low back pain: Secondary | ICD-10-CM | POA: Diagnosis not present

## 2021-05-08 DIAGNOSIS — M5416 Radiculopathy, lumbar region: Secondary | ICD-10-CM | POA: Diagnosis not present

## 2021-05-16 DIAGNOSIS — H524 Presbyopia: Secondary | ICD-10-CM | POA: Diagnosis not present

## 2021-05-16 DIAGNOSIS — M5459 Other low back pain: Secondary | ICD-10-CM | POA: Diagnosis not present

## 2021-05-16 DIAGNOSIS — Z961 Presence of intraocular lens: Secondary | ICD-10-CM | POA: Diagnosis not present

## 2021-05-16 DIAGNOSIS — H52201 Unspecified astigmatism, right eye: Secondary | ICD-10-CM | POA: Diagnosis not present

## 2021-05-23 DIAGNOSIS — M5459 Other low back pain: Secondary | ICD-10-CM | POA: Diagnosis not present

## 2021-05-28 DIAGNOSIS — M5459 Other low back pain: Secondary | ICD-10-CM | POA: Diagnosis not present

## 2021-06-04 DIAGNOSIS — M5459 Other low back pain: Secondary | ICD-10-CM | POA: Diagnosis not present

## 2021-06-17 DIAGNOSIS — M5459 Other low back pain: Secondary | ICD-10-CM | POA: Diagnosis not present

## 2021-06-27 DIAGNOSIS — E7800 Pure hypercholesterolemia, unspecified: Secondary | ICD-10-CM | POA: Diagnosis not present

## 2021-06-27 DIAGNOSIS — I10 Essential (primary) hypertension: Secondary | ICD-10-CM | POA: Diagnosis not present

## 2021-07-11 DIAGNOSIS — Z23 Encounter for immunization: Secondary | ICD-10-CM | POA: Diagnosis not present

## 2021-07-11 DIAGNOSIS — Z1389 Encounter for screening for other disorder: Secondary | ICD-10-CM | POA: Diagnosis not present

## 2021-07-11 DIAGNOSIS — E7800 Pure hypercholesterolemia, unspecified: Secondary | ICD-10-CM | POA: Diagnosis not present

## 2021-07-11 DIAGNOSIS — I10 Essential (primary) hypertension: Secondary | ICD-10-CM | POA: Diagnosis not present

## 2021-07-11 DIAGNOSIS — Z0000 Encounter for general adult medical examination without abnormal findings: Secondary | ICD-10-CM | POA: Diagnosis not present

## 2021-07-29 DIAGNOSIS — Z833 Family history of diabetes mellitus: Secondary | ICD-10-CM | POA: Diagnosis not present

## 2021-07-29 DIAGNOSIS — K219 Gastro-esophageal reflux disease without esophagitis: Secondary | ICD-10-CM | POA: Diagnosis not present

## 2021-07-29 DIAGNOSIS — Z885 Allergy status to narcotic agent status: Secondary | ICD-10-CM | POA: Diagnosis not present

## 2021-07-29 DIAGNOSIS — G8929 Other chronic pain: Secondary | ICD-10-CM | POA: Diagnosis not present

## 2021-07-29 DIAGNOSIS — Z809 Family history of malignant neoplasm, unspecified: Secondary | ICD-10-CM | POA: Diagnosis not present

## 2021-07-29 DIAGNOSIS — M1990 Unspecified osteoarthritis, unspecified site: Secondary | ICD-10-CM | POA: Diagnosis not present

## 2021-07-29 DIAGNOSIS — Z8249 Family history of ischemic heart disease and other diseases of the circulatory system: Secondary | ICD-10-CM | POA: Diagnosis not present

## 2021-07-29 DIAGNOSIS — I10 Essential (primary) hypertension: Secondary | ICD-10-CM | POA: Diagnosis not present

## 2021-10-15 DIAGNOSIS — M5416 Radiculopathy, lumbar region: Secondary | ICD-10-CM | POA: Diagnosis not present

## 2021-10-15 DIAGNOSIS — M542 Cervicalgia: Secondary | ICD-10-CM | POA: Diagnosis not present

## 2021-10-31 DIAGNOSIS — M5416 Radiculopathy, lumbar region: Secondary | ICD-10-CM | POA: Diagnosis not present

## 2021-11-19 DIAGNOSIS — M5416 Radiculopathy, lumbar region: Secondary | ICD-10-CM | POA: Diagnosis not present

## 2022-01-02 DIAGNOSIS — I10 Essential (primary) hypertension: Secondary | ICD-10-CM | POA: Diagnosis not present

## 2022-01-13 DIAGNOSIS — K219 Gastro-esophageal reflux disease without esophagitis: Secondary | ICD-10-CM | POA: Diagnosis not present

## 2022-01-13 DIAGNOSIS — I10 Essential (primary) hypertension: Secondary | ICD-10-CM | POA: Diagnosis not present

## 2022-01-13 DIAGNOSIS — M5136 Other intervertebral disc degeneration, lumbar region: Secondary | ICD-10-CM | POA: Diagnosis not present

## 2022-01-13 DIAGNOSIS — E7800 Pure hypercholesterolemia, unspecified: Secondary | ICD-10-CM | POA: Diagnosis not present

## 2022-02-13 ENCOUNTER — Encounter (HOSPITAL_COMMUNITY): Payer: Self-pay

## 2022-02-17 DIAGNOSIS — M5416 Radiculopathy, lumbar region: Secondary | ICD-10-CM | POA: Diagnosis not present

## 2022-02-17 DIAGNOSIS — M5459 Other low back pain: Secondary | ICD-10-CM | POA: Diagnosis not present

## 2022-02-17 DIAGNOSIS — M1711 Unilateral primary osteoarthritis, right knee: Secondary | ICD-10-CM | POA: Diagnosis not present

## 2022-03-06 ENCOUNTER — Other Ambulatory Visit: Payer: Self-pay | Admitting: Physical Medicine and Rehabilitation

## 2022-03-06 DIAGNOSIS — M5416 Radiculopathy, lumbar region: Secondary | ICD-10-CM

## 2022-03-14 ENCOUNTER — Other Ambulatory Visit: Payer: Self-pay | Admitting: Physical Medicine and Rehabilitation

## 2022-03-14 ENCOUNTER — Ambulatory Visit
Admission: RE | Admit: 2022-03-14 | Discharge: 2022-03-14 | Disposition: A | Discharge status: Home / Self Care | Payer: Medicare PPO | Source: Ambulatory Visit | Attending: Physical Medicine and Rehabilitation | Admitting: Physical Medicine and Rehabilitation

## 2022-03-14 DIAGNOSIS — M5416 Radiculopathy, lumbar region: Secondary | ICD-10-CM | POA: Diagnosis not present

## 2022-03-14 MED ORDER — IOPAMIDOL (ISOVUE-M 200) INJECTION 41%
1.0000 mL | Freq: Once | INTRAMUSCULAR | Status: AC
  Administered 2022-03-14: 1 mL via EPIDURAL

## 2022-03-14 MED ORDER — METHYLPREDNISOLONE ACETATE 40 MG/ML INJ SUSP (RADIOLOG
80.0000 mg | Freq: Once | INTRAMUSCULAR | Status: AC
  Administered 2022-03-14: 80 mg via EPIDURAL

## 2022-03-14 NOTE — Discharge Instructions (Signed)

## 2022-03-27 DIAGNOSIS — Z1231 Encounter for screening mammogram for malignant neoplasm of breast: Secondary | ICD-10-CM | POA: Diagnosis not present

## 2022-03-27 DIAGNOSIS — Z6828 Body mass index (BMI) 28.0-28.9, adult: Secondary | ICD-10-CM | POA: Diagnosis not present

## 2022-03-27 DIAGNOSIS — N952 Postmenopausal atrophic vaginitis: Secondary | ICD-10-CM | POA: Diagnosis not present

## 2022-03-27 DIAGNOSIS — Z01419 Encounter for gynecological examination (general) (routine) without abnormal findings: Secondary | ICD-10-CM | POA: Diagnosis not present

## 2022-05-20 DIAGNOSIS — H524 Presbyopia: Secondary | ICD-10-CM | POA: Diagnosis not present

## 2022-05-20 DIAGNOSIS — Z961 Presence of intraocular lens: Secondary | ICD-10-CM | POA: Diagnosis not present

## 2022-05-20 DIAGNOSIS — H52201 Unspecified astigmatism, right eye: Secondary | ICD-10-CM | POA: Diagnosis not present

## 2022-07-01 DIAGNOSIS — L309 Dermatitis, unspecified: Secondary | ICD-10-CM | POA: Diagnosis not present

## 2022-08-01 DIAGNOSIS — Z136 Encounter for screening for cardiovascular disorders: Secondary | ICD-10-CM | POA: Diagnosis not present

## 2022-08-01 DIAGNOSIS — Z0000 Encounter for general adult medical examination without abnormal findings: Secondary | ICD-10-CM | POA: Diagnosis not present

## 2022-08-05 DIAGNOSIS — Z1331 Encounter for screening for depression: Secondary | ICD-10-CM | POA: Diagnosis not present

## 2022-08-05 DIAGNOSIS — K219 Gastro-esophageal reflux disease without esophagitis: Secondary | ICD-10-CM | POA: Diagnosis not present

## 2022-08-05 DIAGNOSIS — I10 Essential (primary) hypertension: Secondary | ICD-10-CM | POA: Diagnosis not present

## 2022-08-05 DIAGNOSIS — Z23 Encounter for immunization: Secondary | ICD-10-CM | POA: Diagnosis not present

## 2022-08-05 DIAGNOSIS — R21 Rash and other nonspecific skin eruption: Secondary | ICD-10-CM | POA: Diagnosis not present

## 2022-08-05 DIAGNOSIS — Z0000 Encounter for general adult medical examination without abnormal findings: Secondary | ICD-10-CM | POA: Diagnosis not present

## 2022-08-05 DIAGNOSIS — M810 Age-related osteoporosis without current pathological fracture: Secondary | ICD-10-CM | POA: Diagnosis not present

## 2022-08-05 DIAGNOSIS — E785 Hyperlipidemia, unspecified: Secondary | ICD-10-CM | POA: Diagnosis not present

## 2022-08-27 ENCOUNTER — Other Ambulatory Visit: Payer: Self-pay | Admitting: Family Medicine

## 2022-08-27 DIAGNOSIS — M5416 Radiculopathy, lumbar region: Secondary | ICD-10-CM

## 2022-09-08 ENCOUNTER — Ambulatory Visit
Admission: RE | Admit: 2022-09-08 | Discharge: 2022-09-08 | Disposition: A | Discharge status: Home / Self Care | Payer: Medicare PPO | Source: Ambulatory Visit | Attending: Family Medicine | Admitting: Family Medicine

## 2022-09-08 ENCOUNTER — Other Ambulatory Visit: Payer: Self-pay | Admitting: Family Medicine

## 2022-09-08 DIAGNOSIS — M5416 Radiculopathy, lumbar region: Secondary | ICD-10-CM

## 2022-09-08 DIAGNOSIS — M4727 Other spondylosis with radiculopathy, lumbosacral region: Secondary | ICD-10-CM | POA: Diagnosis not present

## 2022-09-08 MED ORDER — METHYLPREDNISOLONE ACETATE 40 MG/ML INJ SUSP (RADIOLOG
80.0000 mg | Freq: Once | INTRAMUSCULAR | Status: AC
  Administered 2022-09-08: 120 mg via EPIDURAL

## 2022-09-08 MED ORDER — IOPAMIDOL (ISOVUE-M 200) INJECTION 41%
1.0000 mL | Freq: Once | INTRAMUSCULAR | Status: AC
  Administered 2022-09-08: 1 mL via EPIDURAL

## 2022-09-08 NOTE — Discharge Instructions (Signed)

## 2022-09-25 DIAGNOSIS — F419 Anxiety disorder, unspecified: Secondary | ICD-10-CM | POA: Diagnosis not present

## 2022-09-25 DIAGNOSIS — F4321 Adjustment disorder with depressed mood: Secondary | ICD-10-CM | POA: Diagnosis not present

## 2022-09-25 DIAGNOSIS — G479 Sleep disorder, unspecified: Secondary | ICD-10-CM | POA: Diagnosis not present

## 2022-10-20 DIAGNOSIS — L239 Allergic contact dermatitis, unspecified cause: Secondary | ICD-10-CM | POA: Diagnosis not present

## 2022-10-30 DIAGNOSIS — F419 Anxiety disorder, unspecified: Secondary | ICD-10-CM | POA: Diagnosis not present

## 2022-10-30 DIAGNOSIS — F4321 Adjustment disorder with depressed mood: Secondary | ICD-10-CM | POA: Diagnosis not present

## 2022-10-30 DIAGNOSIS — G479 Sleep disorder, unspecified: Secondary | ICD-10-CM | POA: Diagnosis not present

## 2022-11-28 DIAGNOSIS — R0981 Nasal congestion: Secondary | ICD-10-CM | POA: Diagnosis not present

## 2022-11-28 DIAGNOSIS — U071 COVID-19: Secondary | ICD-10-CM | POA: Diagnosis not present

## 2023-01-29 DIAGNOSIS — E7800 Pure hypercholesterolemia, unspecified: Secondary | ICD-10-CM | POA: Diagnosis not present

## 2023-01-29 DIAGNOSIS — I10 Essential (primary) hypertension: Secondary | ICD-10-CM | POA: Diagnosis not present

## 2023-02-03 DIAGNOSIS — K219 Gastro-esophageal reflux disease without esophagitis: Secondary | ICD-10-CM | POA: Diagnosis not present

## 2023-02-03 DIAGNOSIS — R1084 Generalized abdominal pain: Secondary | ICD-10-CM | POA: Diagnosis not present

## 2023-02-03 DIAGNOSIS — G479 Sleep disorder, unspecified: Secondary | ICD-10-CM | POA: Diagnosis not present

## 2023-02-03 DIAGNOSIS — R1110 Vomiting, unspecified: Secondary | ICD-10-CM | POA: Diagnosis not present

## 2023-02-03 DIAGNOSIS — M2550 Pain in unspecified joint: Secondary | ICD-10-CM | POA: Diagnosis not present

## 2023-02-03 DIAGNOSIS — E7800 Pure hypercholesterolemia, unspecified: Secondary | ICD-10-CM | POA: Diagnosis not present

## 2023-02-03 DIAGNOSIS — F419 Anxiety disorder, unspecified: Secondary | ICD-10-CM | POA: Diagnosis not present

## 2023-02-03 DIAGNOSIS — I10 Essential (primary) hypertension: Secondary | ICD-10-CM | POA: Diagnosis not present

## 2023-02-03 DIAGNOSIS — F4321 Adjustment disorder with depressed mood: Secondary | ICD-10-CM | POA: Diagnosis not present

## 2023-02-05 ENCOUNTER — Other Ambulatory Visit: Payer: Self-pay | Admitting: Family Medicine

## 2023-02-05 DIAGNOSIS — R1084 Generalized abdominal pain: Secondary | ICD-10-CM

## 2023-02-12 ENCOUNTER — Other Ambulatory Visit: Payer: Medicare PPO

## 2023-03-11 ENCOUNTER — Ambulatory Visit
Admission: RE | Admit: 2023-03-11 | Discharge: 2023-03-11 | Disposition: A | Discharge status: Home / Self Care | Payer: Medicare PPO | Source: Ambulatory Visit | Attending: Family Medicine | Admitting: Family Medicine

## 2023-03-11 DIAGNOSIS — R1032 Left lower quadrant pain: Secondary | ICD-10-CM | POA: Diagnosis not present

## 2023-03-11 DIAGNOSIS — R1110 Vomiting, unspecified: Secondary | ICD-10-CM | POA: Diagnosis not present

## 2023-03-11 DIAGNOSIS — R1084 Generalized abdominal pain: Secondary | ICD-10-CM

## 2023-03-11 MED ORDER — IOPAMIDOL (ISOVUE-300) INJECTION 61%
100.0000 mL | Freq: Once | INTRAVENOUS | Status: AC | PRN
  Administered 2023-03-11: 100 mL via INTRAVENOUS

## 2023-03-27 DIAGNOSIS — K219 Gastro-esophageal reflux disease without esophagitis: Secondary | ICD-10-CM | POA: Diagnosis not present

## 2023-03-27 DIAGNOSIS — R1310 Dysphagia, unspecified: Secondary | ICD-10-CM | POA: Diagnosis not present

## 2023-03-30 ENCOUNTER — Other Ambulatory Visit: Payer: Self-pay | Admitting: Physical Medicine and Rehabilitation

## 2023-03-30 DIAGNOSIS — M79601 Pain in right arm: Secondary | ICD-10-CM | POA: Diagnosis not present

## 2023-03-30 DIAGNOSIS — M79605 Pain in left leg: Secondary | ICD-10-CM | POA: Diagnosis not present

## 2023-03-30 DIAGNOSIS — R200 Anesthesia of skin: Secondary | ICD-10-CM | POA: Diagnosis not present

## 2023-03-30 DIAGNOSIS — M5416 Radiculopathy, lumbar region: Secondary | ICD-10-CM | POA: Diagnosis not present

## 2023-03-31 DIAGNOSIS — N952 Postmenopausal atrophic vaginitis: Secondary | ICD-10-CM | POA: Diagnosis not present

## 2023-03-31 DIAGNOSIS — Z13820 Encounter for screening for osteoporosis: Secondary | ICD-10-CM | POA: Diagnosis not present

## 2023-03-31 DIAGNOSIS — Z124 Encounter for screening for malignant neoplasm of cervix: Secondary | ICD-10-CM | POA: Diagnosis not present

## 2023-03-31 DIAGNOSIS — M8588 Other specified disorders of bone density and structure, other site: Secondary | ICD-10-CM | POA: Diagnosis not present

## 2023-03-31 DIAGNOSIS — Z1231 Encounter for screening mammogram for malignant neoplasm of breast: Secondary | ICD-10-CM | POA: Diagnosis not present

## 2023-03-31 DIAGNOSIS — N958 Other specified menopausal and perimenopausal disorders: Secondary | ICD-10-CM | POA: Diagnosis not present

## 2023-03-31 DIAGNOSIS — Z6829 Body mass index (BMI) 29.0-29.9, adult: Secondary | ICD-10-CM | POA: Diagnosis not present

## 2023-03-31 DIAGNOSIS — Z1272 Encounter for screening for malignant neoplasm of vagina: Secondary | ICD-10-CM | POA: Diagnosis not present

## 2023-03-31 DIAGNOSIS — M5136 Other intervertebral disc degeneration, lumbar region: Secondary | ICD-10-CM | POA: Diagnosis not present

## 2023-04-14 DIAGNOSIS — K1379 Other lesions of oral mucosa: Secondary | ICD-10-CM | POA: Diagnosis not present

## 2023-04-14 DIAGNOSIS — K219 Gastro-esophageal reflux disease without esophagitis: Secondary | ICD-10-CM | POA: Diagnosis not present

## 2023-04-14 DIAGNOSIS — R490 Dysphonia: Secondary | ICD-10-CM | POA: Diagnosis not present

## 2023-04-15 ENCOUNTER — Inpatient Hospital Stay: Admission: RE | Admit: 2023-04-15 | Payer: Medicare PPO | Source: Ambulatory Visit

## 2023-05-29 DIAGNOSIS — R490 Dysphonia: Secondary | ICD-10-CM | POA: Diagnosis not present

## 2023-08-27 DIAGNOSIS — E7800 Pure hypercholesterolemia, unspecified: Secondary | ICD-10-CM | POA: Diagnosis not present

## 2023-08-27 DIAGNOSIS — I10 Essential (primary) hypertension: Secondary | ICD-10-CM | POA: Diagnosis not present

## 2023-08-31 DIAGNOSIS — Z1331 Encounter for screening for depression: Secondary | ICD-10-CM | POA: Diagnosis not present

## 2023-08-31 DIAGNOSIS — N3281 Overactive bladder: Secondary | ICD-10-CM | POA: Diagnosis not present

## 2023-08-31 DIAGNOSIS — I10 Essential (primary) hypertension: Secondary | ICD-10-CM | POA: Diagnosis not present

## 2023-08-31 DIAGNOSIS — Z0000 Encounter for general adult medical examination without abnormal findings: Secondary | ICD-10-CM | POA: Diagnosis not present

## 2023-10-28 DIAGNOSIS — R1310 Dysphagia, unspecified: Secondary | ICD-10-CM | POA: Diagnosis not present

## 2023-10-28 DIAGNOSIS — K219 Gastro-esophageal reflux disease without esophagitis: Secondary | ICD-10-CM | POA: Diagnosis not present

## 2023-10-28 DIAGNOSIS — K449 Diaphragmatic hernia without obstruction or gangrene: Secondary | ICD-10-CM | POA: Diagnosis not present

## 2023-10-28 DIAGNOSIS — K222 Esophageal obstruction: Secondary | ICD-10-CM | POA: Diagnosis not present

## 2023-11-30 DIAGNOSIS — R1310 Dysphagia, unspecified: Secondary | ICD-10-CM | POA: Diagnosis not present

## 2023-11-30 DIAGNOSIS — K449 Diaphragmatic hernia without obstruction or gangrene: Secondary | ICD-10-CM | POA: Diagnosis not present

## 2023-11-30 DIAGNOSIS — K3189 Other diseases of stomach and duodenum: Secondary | ICD-10-CM | POA: Diagnosis not present

## 2023-11-30 DIAGNOSIS — K2930 Chronic superficial gastritis without bleeding: Secondary | ICD-10-CM | POA: Diagnosis not present

## 2023-11-30 DIAGNOSIS — K222 Esophageal obstruction: Secondary | ICD-10-CM | POA: Diagnosis not present

## 2023-12-08 DIAGNOSIS — K2930 Chronic superficial gastritis without bleeding: Secondary | ICD-10-CM | POA: Diagnosis not present

## 2024-03-01 DIAGNOSIS — I10 Essential (primary) hypertension: Secondary | ICD-10-CM | POA: Diagnosis not present

## 2024-03-01 DIAGNOSIS — E7800 Pure hypercholesterolemia, unspecified: Secondary | ICD-10-CM | POA: Diagnosis not present

## 2024-03-03 DIAGNOSIS — E7800 Pure hypercholesterolemia, unspecified: Secondary | ICD-10-CM | POA: Diagnosis not present

## 2024-03-03 DIAGNOSIS — L989 Disorder of the skin and subcutaneous tissue, unspecified: Secondary | ICD-10-CM | POA: Diagnosis not present

## 2024-03-03 DIAGNOSIS — K219 Gastro-esophageal reflux disease without esophagitis: Secondary | ICD-10-CM | POA: Diagnosis not present

## 2024-03-03 DIAGNOSIS — I10 Essential (primary) hypertension: Secondary | ICD-10-CM | POA: Diagnosis not present

## 2024-03-03 DIAGNOSIS — F419 Anxiety disorder, unspecified: Secondary | ICD-10-CM | POA: Diagnosis not present

## 2024-03-03 DIAGNOSIS — Z23 Encounter for immunization: Secondary | ICD-10-CM | POA: Diagnosis not present

## 2024-03-03 DIAGNOSIS — M810 Age-related osteoporosis without current pathological fracture: Secondary | ICD-10-CM | POA: Diagnosis not present

## 2024-04-07 DIAGNOSIS — M5416 Radiculopathy, lumbar region: Secondary | ICD-10-CM | POA: Diagnosis not present

## 2024-04-07 DIAGNOSIS — S90222A Contusion of left lesser toe(s) with damage to nail, initial encounter: Secondary | ICD-10-CM | POA: Diagnosis not present

## 2024-04-07 DIAGNOSIS — M79675 Pain in left toe(s): Secondary | ICD-10-CM | POA: Diagnosis not present

## 2024-04-13 DIAGNOSIS — Z1231 Encounter for screening mammogram for malignant neoplasm of breast: Secondary | ICD-10-CM | POA: Diagnosis not present

## 2024-04-13 DIAGNOSIS — N952 Postmenopausal atrophic vaginitis: Secondary | ICD-10-CM | POA: Diagnosis not present

## 2024-04-13 DIAGNOSIS — Z01419 Encounter for gynecological examination (general) (routine) without abnormal findings: Secondary | ICD-10-CM | POA: Diagnosis not present

## 2024-04-13 DIAGNOSIS — M8580 Other specified disorders of bone density and structure, unspecified site: Secondary | ICD-10-CM | POA: Diagnosis not present

## 2024-04-13 DIAGNOSIS — Z6830 Body mass index (BMI) 30.0-30.9, adult: Secondary | ICD-10-CM | POA: Diagnosis not present

## 2024-04-15 ENCOUNTER — Other Ambulatory Visit: Payer: Self-pay | Admitting: Physical Medicine and Rehabilitation

## 2024-04-15 DIAGNOSIS — M5416 Radiculopathy, lumbar region: Secondary | ICD-10-CM

## 2024-04-22 NOTE — Discharge Instructions (Signed)

## 2024-04-25 ENCOUNTER — Ambulatory Visit
Admission: RE | Admit: 2024-04-25 | Discharge: 2024-04-25 | Disposition: A | Discharge status: Home / Self Care | Source: Ambulatory Visit | Attending: Physical Medicine and Rehabilitation | Admitting: Physical Medicine and Rehabilitation

## 2024-04-25 ENCOUNTER — Other Ambulatory Visit: Payer: Self-pay | Admitting: Physical Medicine and Rehabilitation

## 2024-04-25 DIAGNOSIS — M4727 Other spondylosis with radiculopathy, lumbosacral region: Secondary | ICD-10-CM | POA: Diagnosis not present

## 2024-04-25 DIAGNOSIS — M5416 Radiculopathy, lumbar region: Secondary | ICD-10-CM

## 2024-04-25 MED ORDER — METHYLPREDNISOLONE ACETATE 40 MG/ML INJ SUSP (RADIOLOG
80.0000 mg | Freq: Once | INTRAMUSCULAR | Status: AC
  Administered 2024-04-25: 80 mg via EPIDURAL

## 2024-04-25 MED ORDER — IOPAMIDOL (ISOVUE-M 200) INJECTION 41%
1.0000 mL | Freq: Once | INTRAMUSCULAR | Status: AC
  Administered 2024-04-25: 1 mL via EPIDURAL

## 2024-05-03 DIAGNOSIS — J029 Acute pharyngitis, unspecified: Secondary | ICD-10-CM | POA: Diagnosis not present

## 2024-05-03 DIAGNOSIS — K219 Gastro-esophageal reflux disease without esophagitis: Secondary | ICD-10-CM | POA: Diagnosis not present

## 2024-05-18 DIAGNOSIS — M79675 Pain in left toe(s): Secondary | ICD-10-CM | POA: Diagnosis not present

## 2024-05-31 DIAGNOSIS — L309 Dermatitis, unspecified: Secondary | ICD-10-CM | POA: Diagnosis not present

## 2024-05-31 DIAGNOSIS — L089 Local infection of the skin and subcutaneous tissue, unspecified: Secondary | ICD-10-CM | POA: Diagnosis not present

## 2024-05-31 DIAGNOSIS — L905 Scar conditions and fibrosis of skin: Secondary | ICD-10-CM | POA: Diagnosis not present

## 2024-07-13 DIAGNOSIS — L259 Unspecified contact dermatitis, unspecified cause: Secondary | ICD-10-CM | POA: Diagnosis not present

## 2024-07-23 LAB — AMB RESULTS CONSOLE CBG: Glucose: 107

## 2024-08-05 NOTE — Progress Notes (Signed)
 SDOH reviewed

## 2024-09-12 DIAGNOSIS — I10 Essential (primary) hypertension: Secondary | ICD-10-CM | POA: Diagnosis not present

## 2024-09-12 DIAGNOSIS — E7800 Pure hypercholesterolemia, unspecified: Secondary | ICD-10-CM | POA: Diagnosis not present

## 2024-09-22 NOTE — Progress Notes (Signed)
 Pt attended 07/23/2024 screening event with BP of 150/79 and blood sugar was 107. Pt noted at event that she does have a PCP. At event pt did not indicate any SDOH needs. Pt also noted that she is not a smoker and listed Private as her insurance at the event.   Per chart review pt does have a PCP ([facility]), insurance, and is not a smoker. Pt's last appt with 09/15/2024 and has an upcoming appt on 03/14/2025 (appt is not [facility]). Pt does not indicate any SDOH needs at this time.  No additional pt f/u to be scheduled at this time per health equity protocol.
